# Patient Record
Sex: Female | Born: 1970 | Race: White | Hispanic: No | Marital: Married | Smoking: Former smoker
Health system: Southern US, Community
[De-identification: ages and names within clinical notes are randomized; demographics above are authoritative.]

## PROBLEM LIST (undated history)

## (undated) ENCOUNTER — Inpatient Hospital Stay (HOSPITAL_COMMUNITY): Payer: Self-pay

## (undated) DIAGNOSIS — O99280 Endocrine, nutritional and metabolic diseases complicating pregnancy, unspecified trimester: Secondary | ICD-10-CM

## (undated) DIAGNOSIS — R070 Pain in throat: Secondary | ICD-10-CM

## (undated) DIAGNOSIS — E669 Obesity, unspecified: Secondary | ICD-10-CM

## (undated) DIAGNOSIS — R059 Cough, unspecified: Secondary | ICD-10-CM

## (undated) DIAGNOSIS — J340 Abscess, furuncle and carbuncle of nose: Secondary | ICD-10-CM

## (undated) DIAGNOSIS — J310 Chronic rhinitis: Secondary | ICD-10-CM

## (undated) DIAGNOSIS — D62 Acute posthemorrhagic anemia: Secondary | ICD-10-CM

## (undated) DIAGNOSIS — G43909 Migraine, unspecified, not intractable, without status migrainosus: Secondary | ICD-10-CM

## (undated) DIAGNOSIS — I10 Essential (primary) hypertension: Secondary | ICD-10-CM

## (undated) DIAGNOSIS — M792 Neuralgia and neuritis, unspecified: Secondary | ICD-10-CM

## (undated) DIAGNOSIS — F32A Depression, unspecified: Secondary | ICD-10-CM

## (undated) DIAGNOSIS — E039 Hypothyroidism, unspecified: Secondary | ICD-10-CM

## (undated) DIAGNOSIS — J90 Pleural effusion, not elsewhere classified: Secondary | ICD-10-CM

## (undated) DIAGNOSIS — R569 Unspecified convulsions: Secondary | ICD-10-CM

## (undated) DIAGNOSIS — R002 Palpitations: Secondary | ICD-10-CM

## (undated) DIAGNOSIS — R1011 Right upper quadrant pain: Secondary | ICD-10-CM

## (undated) DIAGNOSIS — E559 Vitamin D deficiency, unspecified: Secondary | ICD-10-CM

## (undated) DIAGNOSIS — G514 Facial myokymia: Secondary | ICD-10-CM

## (undated) DIAGNOSIS — M069 Rheumatoid arthritis, unspecified: Secondary | ICD-10-CM

## (undated) DIAGNOSIS — Z630 Problems in relationship with spouse or partner: Secondary | ICD-10-CM

## (undated) DIAGNOSIS — D649 Anemia, unspecified: Secondary | ICD-10-CM

## (undated) DIAGNOSIS — J101 Influenza due to other identified influenza virus with other respiratory manifestations: Secondary | ICD-10-CM

## (undated) DIAGNOSIS — R509 Fever, unspecified: Secondary | ICD-10-CM

## (undated) DIAGNOSIS — E0590 Thyrotoxicosis, unspecified without thyrotoxic crisis or storm: Secondary | ICD-10-CM

## (undated) DIAGNOSIS — R5383 Other fatigue: Secondary | ICD-10-CM

## (undated) DIAGNOSIS — K219 Gastro-esophageal reflux disease without esophagitis: Secondary | ICD-10-CM

## (undated) DIAGNOSIS — F411 Generalized anxiety disorder: Secondary | ICD-10-CM

## (undated) DIAGNOSIS — F419 Anxiety disorder, unspecified: Secondary | ICD-10-CM

## (undated) DIAGNOSIS — O10919 Unspecified pre-existing hypertension complicating pregnancy, unspecified trimester: Secondary | ICD-10-CM

## (undated) HISTORY — DX: Migraine, unspecified, not intractable, without status migrainosus: G43.909

## (undated) HISTORY — DX: Abscess, furuncle and carbuncle of nose: J34.0

## (undated) HISTORY — DX: Fever, unspecified: R50.9

## (undated) HISTORY — DX: Influenza due to other identified influenza virus with other respiratory manifestations: J10.1

## (undated) HISTORY — DX: Right upper quadrant pain: R10.11

## (undated) HISTORY — DX: Depression, unspecified: F32.A

## (undated) HISTORY — DX: Cough, unspecified: R05.9

## (undated) HISTORY — DX: Facial myokymia: G51.4

## (undated) HISTORY — DX: Neuralgia and neuritis, unspecified: M79.2

## (undated) HISTORY — DX: Problems in relationship with spouse or partner: Z63.0

## (undated) HISTORY — DX: Vitamin D deficiency, unspecified: E55.9

## (undated) HISTORY — DX: Palpitations: R00.2

## (undated) HISTORY — DX: Unspecified convulsions: R56.9

## (undated) HISTORY — DX: Chronic rhinitis: J31.0

## (undated) HISTORY — DX: Anxiety disorder, unspecified: F41.9

## (undated) HISTORY — DX: Generalized anxiety disorder: F41.1

## (undated) HISTORY — DX: Other fatigue: R53.83

## (undated) HISTORY — DX: Pain in throat: R07.0

## (undated) HISTORY — DX: Obesity, unspecified: E66.9

## (undated) HISTORY — DX: Pleural effusion, not elsewhere classified: J90

## (undated) SURGERY — Surgical Case
Anesthesia: Regional

---

## 1998-12-24 ENCOUNTER — Emergency Department (HOSPITAL_COMMUNITY): Admission: EM | Admit: 1998-12-24 | Discharge: 1998-12-24 | Payer: Self-pay | Admitting: Emergency Medicine

## 1999-07-07 ENCOUNTER — Other Ambulatory Visit: Admission: RE | Admit: 1999-07-07 | Discharge: 1999-07-07 | Payer: Self-pay | Admitting: *Deleted

## 2001-02-18 ENCOUNTER — Other Ambulatory Visit: Admission: RE | Admit: 2001-02-18 | Discharge: 2001-02-18 | Payer: Self-pay | Admitting: *Deleted

## 2001-04-10 ENCOUNTER — Emergency Department (HOSPITAL_COMMUNITY): Admission: EM | Admit: 2001-04-10 | Discharge: 2001-04-10 | Payer: Self-pay | Admitting: Internal Medicine

## 2001-12-24 DIAGNOSIS — K219 Gastro-esophageal reflux disease without esophagitis: Secondary | ICD-10-CM

## 2001-12-24 HISTORY — DX: Gastro-esophageal reflux disease without esophagitis: K21.9

## 2002-06-22 ENCOUNTER — Other Ambulatory Visit: Admission: RE | Admit: 2002-06-22 | Discharge: 2002-06-22 | Payer: Self-pay | Admitting: Family Medicine

## 2003-06-30 ENCOUNTER — Encounter: Admission: RE | Admit: 2003-06-30 | Discharge: 2003-07-12 | Payer: Self-pay | Admitting: Family Medicine

## 2003-10-14 ENCOUNTER — Other Ambulatory Visit: Admission: RE | Admit: 2003-10-14 | Discharge: 2003-10-14 | Payer: Self-pay | Admitting: *Deleted

## 2004-03-21 ENCOUNTER — Emergency Department (HOSPITAL_COMMUNITY): Admission: AD | Admit: 2004-03-21 | Discharge: 2004-03-21 | Payer: Self-pay | Admitting: Emergency Medicine

## 2004-12-12 ENCOUNTER — Other Ambulatory Visit: Admission: RE | Admit: 2004-12-12 | Discharge: 2004-12-12 | Payer: Self-pay | Admitting: *Deleted

## 2006-01-02 ENCOUNTER — Other Ambulatory Visit: Admission: RE | Admit: 2006-01-02 | Discharge: 2006-01-02 | Payer: Self-pay | Admitting: *Deleted

## 2006-09-30 ENCOUNTER — Encounter: Admission: RE | Admit: 2006-09-30 | Discharge: 2006-09-30 | Payer: Self-pay | Admitting: Family Medicine

## 2007-02-18 ENCOUNTER — Other Ambulatory Visit: Admission: RE | Admit: 2007-02-18 | Discharge: 2007-02-18 | Payer: Self-pay | Admitting: Family Medicine

## 2007-10-14 ENCOUNTER — Encounter: Admission: RE | Admit: 2007-10-14 | Discharge: 2007-10-14 | Payer: Self-pay | Admitting: Family Medicine

## 2007-10-17 ENCOUNTER — Encounter: Admission: RE | Admit: 2007-10-17 | Discharge: 2007-10-17 | Payer: Self-pay | Admitting: Family Medicine

## 2008-03-09 ENCOUNTER — Encounter (INDEPENDENT_AMBULATORY_CARE_PROVIDER_SITE_OTHER): Payer: Self-pay | Admitting: Diagnostic Radiology

## 2008-03-09 ENCOUNTER — Other Ambulatory Visit: Admission: RE | Admit: 2008-03-09 | Discharge: 2008-03-09 | Payer: Self-pay | Admitting: Diagnostic Radiology

## 2008-03-09 ENCOUNTER — Encounter: Admission: RE | Admit: 2008-03-09 | Discharge: 2008-03-09 | Payer: Self-pay | Admitting: Family Medicine

## 2009-06-22 ENCOUNTER — Inpatient Hospital Stay (HOSPITAL_COMMUNITY): Admission: AD | Admit: 2009-06-22 | Discharge: 2009-06-26 | Payer: Self-pay | Admitting: Obstetrics & Gynecology

## 2009-06-23 ENCOUNTER — Encounter (INDEPENDENT_AMBULATORY_CARE_PROVIDER_SITE_OTHER): Payer: Self-pay | Admitting: Obstetrics and Gynecology

## 2009-11-23 DIAGNOSIS — G514 Facial myokymia: Secondary | ICD-10-CM

## 2009-11-23 HISTORY — DX: Facial myokymia: G51.4

## 2010-04-26 ENCOUNTER — Encounter: Admission: RE | Admit: 2010-04-26 | Discharge: 2010-04-26 | Payer: Self-pay | Admitting: Surgery

## 2010-07-04 ENCOUNTER — Encounter (HOSPITAL_COMMUNITY): Admission: RE | Admit: 2010-07-04 | Discharge: 2010-07-05 | Payer: Self-pay | Admitting: Endocrinology

## 2010-09-14 ENCOUNTER — Ambulatory Visit (HOSPITAL_COMMUNITY): Admission: RE | Admit: 2010-09-14 | Discharge: 2010-09-14 | Payer: Self-pay | Admitting: Endocrinology

## 2011-01-14 ENCOUNTER — Encounter: Payer: Self-pay | Admitting: Family Medicine

## 2011-04-01 LAB — CBC
Hemoglobin: 8 g/dL — ABNORMAL LOW (ref 12.0–15.0)
MCHC: 35.7 g/dL (ref 30.0–36.0)
Platelets: 165 10*3/uL (ref 150–400)
RDW: 14.4 % (ref 11.5–15.5)

## 2011-04-02 LAB — CBC
MCHC: 35.3 g/dL (ref 30.0–36.0)
RBC: 3.6 MIL/uL — ABNORMAL LOW (ref 3.87–5.11)
WBC: 12.4 10*3/uL — ABNORMAL HIGH (ref 4.0–10.5)

## 2011-04-02 LAB — RPR: RPR Ser Ql: NONREACTIVE

## 2011-05-08 NOTE — Op Note (Signed)
NAME:  Tracey Nichols, Tracey Nichols                ACCOUNT NO.:  [id number]   MEDICAL RECORD NO.:  [id number]          PATIENT TYPE:  INP   LOCATION:  9114                          FACILITY:  [facility]   PHYSICIAN:  Maxie Better, M.D.DATE OF BIRTH:  16-May-1971   DATE OF PROCEDURE:  06/23/2009  DATE OF DISCHARGE:                               OPERATIVE REPORT   PREOPERATIVE DIAGNOSIS:  Arrest of dilatation, term gestation.   PROCEDURE:  Primary cesarean section, Kerr hysterotomy.   POSTOPERATIVE DIAGNOSIS:  Arrest of dilatation, term gestation.   ANESTHESIA:  Epidural.   SURGEON:  Maxie Better, MD   ASSISTANT:  Genia Del, MD   PROCEDURE:  Under adequate epidural anesthesia, the patient was placed  in the supine position with a left lateral tilt.  She was sterilely  prepped and draped in usual fashion.  Indwelling Foley catheter was  already in place.  Pfannenstiel skin incision was then made carried down  to the rectus fascia.  Rectus fascia was incised in midline.  The rectus  fascia was then bluntly and sharply dissected off the rectus muscle in  superior and inferior fashion.  The rectus muscles split in midline.  The parietal peritoneum was opened sharply and extended.  The  vesicouterine peritoneum was opened carefully in a transverse fashion.  The bladder was then bluntly dissected off the lower uterine segment and  displaced inferiorly.  A curvilinear low transverse uterine incision was  then made and extended with bandage scissors.  Subsequent delivery of a  live female from the right occiput posterior presentation with a large  caput was accomplished.  Baby was bulb suctioned in the abdomen.  Cord  was clamped and cut.  The baby was transferred to the awaiting  pediatrician, who assigned Apgars of 9 and 9 at 1 and 5 minute.  The  placenta was manually removed.  Uterine cavity was cleaned of debris.  Uterine incision had no extension.  It was closed with 0 Monocryl  running  locked stitch.  First layer and second layer was imbricated with  0 Monocryl suture.  Bleeding on the right angle of the incision resulted  in placement of 2 figure-of-eight sutures.  Additional bleeding was  cauterized.  Normal tubes and ovaries were noted to be normal.  The  uterus was transiently exteriorized in order to assess the right angle  of the incision with respect to the bleeding.  Uterus was then returned  to the abdomen after the abdomen was copiously irrigated and suctioned  with good hemostasis noted.  The parietal peritoneum was not closed by  Dr. Seymour Bars.  The rectus fascia was closed with 0 Vicryl x2.  The  subcutaneous areas irrigated, small bleeders cauterized.  Interrupted 2-  0 plain sutures placed in the skin approximated using Ethicon staples.   SPECIMENS:  Placenta sent to pathology due to maternal low-grade  temperature.  Estimated blood loss was 500 mL.  Intraoperative fluid 2  L.  Urine output 200 mL.  Sponge and instrument counts x2 was correct.  Complication was none.  The patient tolerated the procedure well and  was  transferred to recovery in stable condition.      Maxie Better, M.D.  Electronically Signed     [place]/MEDQ  D:  06/23/2009  T:  06/24/2009  Job:  [id number]

## 2011-05-11 NOTE — Discharge Summary (Signed)
NAMESALEEMAH, Tracey Nichols                ACCOUNT NO.:  [id number]   MEDICAL RECORD NO.:  [id number]          PATIENT TYPE:  INP   LOCATION:  9114                          FACILITY:  [facility]   PHYSICIAN:  Maxie Better, M.D.DATE OF BIRTH:  [birth year]   DATE OF ADMISSION:  06/22/2009  DATE OF DISCHARGE:  06/26/2009                               DISCHARGE SUMMARY   ADMISSION DIAGNOSES:  1. Term gestation in labor.  2. Chronic hypertension.  3. Hyperthyroidism.  4. Group B strep culture positive.   DISCHARGE DIAGNOSES:  1. Term gestation, delivered.  2. Arrest of dilatation.  3. Hyperthyroidism.  4. Chronic hypertension.  5. Postop anemia.   PROCEDURE:  Primary cesarean section Kerr hysterotomy.   HISTORY OF PRESENT ILLNESS:  A 40 year old gravida 1, para 0 female at  39-3/7 weeks' and was admitted in early labor by the midwife.  The  prenatal course had been notable for hyperthyroidism for which the  patient has been on PTU.  She is on chronic hypertension, well  controlled, on Aldomet and she is group B strep culture positive.   HOSPITAL COURSE:  The patient was admitted on June 22, 2009, at that  time she was 2-3, 90%, -1, bulging bag of membranes.  Her tracing was  reactive.  She was contracting every 3-4 minutes and it lasting 60-80  seconds with moderate intensity.  She was started on antibiotics due to  group B strep being positive.  She was continued on her blood pressure  medication and her PTU.  The patient subsequently had Pitocin  augmentation of her labor.  She had artificial rupture of membranes when  she was 4 cm, at 90%, -1.  Intrauterine pressure catheter was placed at  that time.  The patient had epidural for pain management.  During her  labor course, the patient had amnioinfusion placed for variable  deceleration associated with her contractions.  She was subsequently  progressed to 9 cm/ 80-90%/+1 with a right occiput posterior  presentation with overriding  sutures.  The patient was given the option  to continue or to proceed with a primary cesarean section.  The patient  requested a cesarean section due to lack of progress and she was taken  to the operating room, where she underwent a primary cesarean section,  delivery of a live female, 7 pounds 6 ounces from the right occiput  posterior presentation.  Normal tubes and ovaries.  Apgars of 9 and 9.  Her postoperative course was unremarkable.  Her CBC on postop day #1  showed a hemoglobin of 8, hematocrit of 22.4, platelet count of 165,000,  and white count of 10.7.  On June 24, 2009, the patient had her Aldomet  discontinued.  She was asymptomatic from her anemia.  Iron  supplementation was started.  The patient continued to do well.  By  postop day #3, the patient was deemed well to be discharged home.  She  was tolerating a regular diet.  She was afebrile.  Incision had no  evidence of infection.   DISPOSITION:  Home.   CONDITION:  Stable.  DISCHARGE MEDICATIONS:  1. PTU 50 mg p.o. every other day.  2. Motrin 600 mg every 6 hours p.r.n. pain.  3. Percocet 1-2 tablets every 3-4 hours p.r.n. pain.   DISCHARGE INSTRUCTIONS:  Per the postpartum booklet given.  Followup  appointment at [facility] in 6 weeks.      Maxie Better, M.D.  Electronically Signed     [place]/MEDQ  D:  07/10/2009  T:  07/11/2009  Job:  [id number]

## 2011-10-10 LAB — OB RESULTS CONSOLE ANTIBODY SCREEN: Antibody Screen: NEGATIVE

## 2011-10-10 LAB — OB RESULTS CONSOLE ABO/RH

## 2011-10-10 LAB — OB RESULTS CONSOLE HIV ANTIBODY (ROUTINE TESTING): HIV: NONREACTIVE

## 2011-10-10 LAB — OB RESULTS CONSOLE RUBELLA ANTIBODY, IGM: Rubella: IMMUNE

## 2011-12-24 ENCOUNTER — Other Ambulatory Visit: Payer: Self-pay

## 2012-01-03 ENCOUNTER — Ambulatory Visit (HOSPITAL_COMMUNITY)
Admission: RE | Admit: 2012-01-03 | Discharge: 2012-01-03 | Disposition: A | Discharge status: Home / Self Care | Payer: 59 | Source: Ambulatory Visit | Attending: Obstetrics & Gynecology | Admitting: Obstetrics & Gynecology

## 2012-01-03 DIAGNOSIS — O09529 Supervision of elderly multigravida, unspecified trimester: Secondary | ICD-10-CM

## 2012-01-03 DIAGNOSIS — E039 Hypothyroidism, unspecified: Secondary | ICD-10-CM

## 2012-01-03 DIAGNOSIS — M069 Rheumatoid arthritis, unspecified: Secondary | ICD-10-CM

## 2012-01-03 DIAGNOSIS — O10919 Unspecified pre-existing hypertension complicating pregnancy, unspecified trimester: Secondary | ICD-10-CM

## 2012-01-03 DIAGNOSIS — O99280 Endocrine, nutritional and metabolic diseases complicating pregnancy, unspecified trimester: Secondary | ICD-10-CM

## 2012-01-03 HISTORY — DX: Thyrotoxicosis, unspecified without thyrotoxic crisis or storm: E05.90

## 2012-01-03 HISTORY — DX: Hypothyroidism, unspecified: E03.9

## 2012-01-03 HISTORY — DX: Essential (primary) hypertension: I10

## 2012-01-03 HISTORY — DX: Rheumatoid arthritis, unspecified: M06.9

## 2012-01-04 ENCOUNTER — Encounter (HOSPITAL_COMMUNITY): Payer: Self-pay

## 2012-01-04 NOTE — Progress Notes (Signed)
MATERNAL FETAL MEDICINE CONSULT  Patient Name: Tracey Nichols Medical Record Number:  [id number] Date of Birth: May 18, 1971 Requesting Physician Name:  No att. providers found Date of Service: 01/04/2012  Chief Complaint Multiple medical conditions in pregnancy  History of Present Illness Tracey VANOVERBEKE was seen today  secondary to multiple medical problems in pregnancy at the request of Dr. Juliene Pina.  The patient is a 41 y.o. G2P1001 carrying a dichorionic diamniotic twin pregnancy at [redacted]w[redacted]d, with an EDC of 05/18/2012, by Last Menstrual Period.  Her medical problems include rheumatoid arthritis, a history of hyperthyroidism treated with radioiodine thyroid ablation resulting in hypothyroidism, chronic hypertension, and advanced maternal age.  Her rheumatoid arthritis is long standing and predominantly effects all the joints of her upper extremities with virtual sparing of her lower extremities.  She is currently on no medication for her RA and reports increasing problems with upper extremity joint pain since pregnancy began.  She has not received pain relief with acetaminophen. In her first pregnancy her RA symptoms noticeably improved after the 3rd month of pregnancy, but remission has not occurred thus far in this pregnancy.  In her first pregnancy she developed hyperthyroidism and required propylthiouracil therapy, on which she developed significant neutropenia.  After pregnancy the hyperthyroidism spontaneously resolved.  She underwent a radioiodine thyroid ablation to prevent future recurrences of hyperthyroidism.  She is now hypothyroid and on Synthroid replacement (125 mcg po daily).  She has a history of mild hypertension beginning in her early 80's.  Outside of pregnancy her hypertension was under good control on lisinopril.  This was switched to methyldopa (250 mg po bid) prior to conception and her blood pressures has remained in the 110's-120's/70's-80's.    Review of Systems Pertinent items are  noted in HPI.  Patient History OB History    Grav Para Term Preterm Abortions TAB SAB Ect Mult Living   2 1 1       1      # Outc Date GA Lbr Len/2nd Wgt Sex Del Anes PTL Lv   1 TRM 2010 [redacted]w[redacted]d    LTCS  No Yes   Comments: Arrest of 1st stage   2 CUR               Past Medical History  Diagnosis Date  . Hyperthyroidism     Had hyperthyroidism in first pregnancy, subsequently treated with radioiodine  . Hypothyroidism     s/p radioiodine treatment for hyperthyroidism  . Rheumatoid arthritis   . Hypertension     Past Surgical History  Procedure Date  . Cesarean section     History   Social History  . Marital Status: Married    Spouse Name: N/A    Number of Children: N/A  . Years of Education: N/A   Social History Main Topics  . Smoking status: Never Smoker   . Smokeless tobacco: Never Used  . Alcohol Use: No  . Drug Use: No  . Sexually Active: Yes    Birth Control/ Protection: None   Other Topics Concern  . None   Social History Narrative  . None   In addition, the patient has no family history of mental retardation, birth defects, or genetic diseases.  Physical Examination Vitals:  BP 111/71, Pulse 95, Weight 157 lbs. General appearance - alert, well appearing, and in no distress  Assessment and Recommendations 1.  Rheumatoid Arthritis.  Tracey Nichols symptoms appear to be worsening since stopping her RA medications.  She has been  treated with hydroxychloroquine, Humira, and methotrexate in the past with varying degrees of success.  Hydroxychloroquine would be the most appropriate choice for treatment during pregnancy as it has a favorable fetal risk profile compared to Humira and methotrexate.  I discussed restarting this medication, but she is reluctant to do so due to concern for fetal effects.  She feels her pain level is manageable at this time, but will reconsider starting the medication if her symptoms worsen.  Although less common than in other autoimmune  diseases, RA can lead to nephritis and hypertension which can arise for the first time or become exacerbated in pregnancy.  To establish a baseline for the patient a CBC, AST, ALT, serum creatinine, and a 24 hour urine collection for protein should be performed.  These labs should be repeated as clinically indicated based on disease symptoms or the appearance of hypertension.  As autoimmune diseases are associated with fetal growth restriction the patient should have serial growth scans every 4 weeks after her anatomy scan at approximately 18 weeks.  2.  Hypothyroidism.  Tracey Nichols is now euthyroid on her current dose of Synthroid based on her last thyroid function tests.  Another set was just drawn and is pending.  Her thyroid function tests are being monitored approximately every 4 weeks and adjusted upward as needed.  Given her twin pregnancy relatively large amounts of Synthroid will likely be required as pregnancy progresses.  Other than this serial laboratory monitoring no further maternal or fetal assessments are required. 3.  Chronic Hypertension.  Based on Tracey Nichols's history and her BP today it appears as though her hypertension is rather mild and easily to control.  Today her BP is normal on a very modest dose of methyldopa.  It would be reasonable to continue with this medication and even adjust it upward as needed to maximum dose of 500 mg po four times daily.  Ultimately, if this maximum dose does not provide adequate blood pressure control an additional age such as labetalol or nifedipine could be added and titrated upward. 4.  Advanced Maternal Age.  We discussed the risks of aneuploidy associated with a maternal age of 89.  The patient has had a normal first trimester screen and MSAFP.  She is not interested in meeting with a genetic counselor or having invasive genetic testing. 5.  Dichorionic Diamniotic Twin Pregnancy.  [name] has a dichorionic diamniotictwin pregnancy.  As a result she  will require monthly ultrasounds to assess fetal growth.  In addition, given her other medical comorbidities once or twice weekly fetal testing should be begun at 32 weeks and continued until delivery.  An elective delivery should not be undertaken before 38 weeks of gestation, but delivery can be effected at an earlier gestational age if medically or obstetrically indicated.  I spent 45 minutes with Ms. Ericsson today of which 50% was face-to-face counseling.  Rema Fendt

## 2012-01-31 ENCOUNTER — Encounter: Payer: Self-pay | Admitting: Obstetrics & Gynecology

## 2012-01-31 LAB — US OB COMP + 14 WK

## 2012-02-04 ENCOUNTER — Other Ambulatory Visit (HOSPITAL_COMMUNITY): Payer: Self-pay | Admitting: Obstetrics & Gynecology

## 2012-02-04 DIAGNOSIS — IMO0001 Reserved for inherently not codable concepts without codable children: Secondary | ICD-10-CM

## 2012-02-12 ENCOUNTER — Ambulatory Visit (HOSPITAL_COMMUNITY)
Admission: RE | Admit: 2012-02-12 | Discharge: 2012-02-12 | Disposition: A | Discharge status: Home / Self Care | Payer: 59 | Source: Ambulatory Visit | Attending: Obstetrics & Gynecology | Admitting: Obstetrics & Gynecology

## 2012-02-12 DIAGNOSIS — O10019 Pre-existing essential hypertension complicating pregnancy, unspecified trimester: Secondary | ICD-10-CM | POA: Insufficient documentation

## 2012-02-12 DIAGNOSIS — O30009 Twin pregnancy, unspecified number of placenta and unspecified number of amniotic sacs, unspecified trimester: Secondary | ICD-10-CM | POA: Insufficient documentation

## 2012-02-12 DIAGNOSIS — Z1389 Encounter for screening for other disorder: Secondary | ICD-10-CM | POA: Insufficient documentation

## 2012-02-12 DIAGNOSIS — O09529 Supervision of elderly multigravida, unspecified trimester: Secondary | ICD-10-CM | POA: Insufficient documentation

## 2012-02-12 DIAGNOSIS — O358XX0 Maternal care for other (suspected) fetal abnormality and damage, not applicable or unspecified: Secondary | ICD-10-CM | POA: Insufficient documentation

## 2012-02-12 DIAGNOSIS — O350XX0 Maternal care for (suspected) central nervous system malformation in fetus, not applicable or unspecified: Secondary | ICD-10-CM | POA: Insufficient documentation

## 2012-02-12 DIAGNOSIS — O34219 Maternal care for unspecified type scar from previous cesarean delivery: Secondary | ICD-10-CM | POA: Insufficient documentation

## 2012-02-12 DIAGNOSIS — IMO0001 Reserved for inherently not codable concepts without codable children: Secondary | ICD-10-CM

## 2012-02-12 DIAGNOSIS — O3500X0 Maternal care for (suspected) central nervous system malformation or damage in fetus, unspecified, not applicable or unspecified: Secondary | ICD-10-CM | POA: Insufficient documentation

## 2012-02-12 DIAGNOSIS — Z363 Encounter for antenatal screening for malformations: Secondary | ICD-10-CM | POA: Insufficient documentation

## 2012-02-13 ENCOUNTER — Other Ambulatory Visit: Payer: Self-pay

## 2012-03-16 ENCOUNTER — Inpatient Hospital Stay (HOSPITAL_COMMUNITY)
Admission: AD | Admit: 2012-03-16 | Discharge: 2012-03-16 | Disposition: A | Discharge status: Hospice | Payer: 59 | Source: Ambulatory Visit | Attending: Obstetrics & Gynecology | Admitting: Obstetrics & Gynecology

## 2012-03-16 DIAGNOSIS — O30049 Twin pregnancy, dichorionic/diamniotic, unspecified trimester: Secondary | ICD-10-CM | POA: Insufficient documentation

## 2012-03-16 DIAGNOSIS — E039 Hypothyroidism, unspecified: Secondary | ICD-10-CM | POA: Insufficient documentation

## 2012-03-16 DIAGNOSIS — O4700 False labor before 37 completed weeks of gestation, unspecified trimester: Secondary | ICD-10-CM | POA: Insufficient documentation

## 2012-03-16 DIAGNOSIS — E079 Disorder of thyroid, unspecified: Secondary | ICD-10-CM | POA: Insufficient documentation

## 2012-03-16 DIAGNOSIS — O10019 Pre-existing essential hypertension complicating pregnancy, unspecified trimester: Secondary | ICD-10-CM | POA: Insufficient documentation

## 2012-03-16 DIAGNOSIS — O30009 Twin pregnancy, unspecified number of placenta and unspecified number of amniotic sacs, unspecified trimester: Secondary | ICD-10-CM | POA: Insufficient documentation

## 2012-03-16 LAB — URINALYSIS, ROUTINE W REFLEX MICROSCOPIC
Glucose, UA: NEGATIVE mg/dL
Leukocytes, UA: NEGATIVE
Nitrite: NEGATIVE
Protein, ur: NEGATIVE mg/dL

## 2012-03-16 NOTE — Discharge Instructions (Signed)

## 2012-03-16 NOTE — ED Provider Notes (Signed)
History  Tracey Nichols 41 y.o. G2P1001 Twin gestation Di-Di 31 wks  RP:  UCs x couple of hours  HPP/HPI:  Mild UC's more frequent x 2 hrs, q about 4-5 min.  No AF leak.  FMs pos.  No vaginal bleeding.                  Mild cHTN, no Rx needed currently.  Hypothyroidism on Synthroid.  RA stable.  No PIH Sx.       Chief Complaint  Patient presents with  . Contractions    OB History    Grav Para Term Preterm Abortions TAB SAB Ect Mult Living   2 1 1       1       Past Medical History  Diagnosis Date  . Hyperthyroidism     Had hyperthyroidism in first pregnancy, subsequently treated with radioiodine  . Hypothyroidism     s/p radioiodine treatment for hyperthyroidism  . Rheumatoid arthritis   . Hypertension     Past Surgical History  Procedure Date  . Cesarean section     No family history on file.  History  Substance Use Topics  . Smoking status: Never Smoker   . Smokeless tobacco: Never Used  . Alcohol Use: No    Allergies:  Allergies  Allergen Reactions  . Propylthiouracil Other (See Comments)    Lowered white blood cell count  . Wellbutrin (Bupropion Hcl) Other (See Comments)    seizure  . Sulfa Antibiotics Rash    Prescriptions prior to admission  Medication Sig Dispense Refill  . acetaminophen (TYLENOL) 500 MG tablet Take 1,000 mg by mouth every 6 (six) hours as needed. pain      . calcium carbonate (TUMS - DOSED IN MG ELEMENTAL CALCIUM) 500 MG chewable tablet Chew 1 tablet by mouth daily as needed. Stomach acid      . ferrous sulfate 325 (65 FE) MG tablet Take 325 mg by mouth 3 (three) times a week.      . hydroxychloroquine (PLAQUENIL) 200 MG tablet Take 200 mg by mouth daily.       Marland Kitchen levothyroxine (SYNTHROID, LEVOTHROID) 125 MCG tablet Take 125 mcg by mouth daily.      . Prenatal Vit-Fe Fumarate-FA (PRENATAL MULTIVITAMIN) TABS Take 1 tablet by mouth daily.        Physical Exam   Blood pressure 143/76, pulse 95, temperature 97.6 F (36.4 C),  resp. rate 20, height 5' 2.5" (1.588 m), weight 74.39 kg (164 lb), last menstrual period 08/12/2011, SpO2 100.00%.  Ut  UC felt, mild VE closed/long/firm/post/high  ED Course  UC mild, spacing to 5-7 min.   FHR A 140's, B 150's  Variability present, no deceleration.  U/A neg FFN pending  A/P  31 wks Twin Di-Di no PT cervical change.  BHs per monitoring and UC palpation.  Pending FFN.         Not in active labor.  BMethasone discussed.  Will hold for now if FFN neg.         D/C home with relative bed rest if FFN neg.  PTL warnings discussed.  F/U with me later this wk.    Genia Del  MD  03/16/2012  At 10:43 PM

## 2012-03-16 NOTE — MAU Note (Signed)
Pt reports feeling tightness and cramping since 6 pm, became more like contractions and was told to come in. Denies bleeding or ROM

## 2012-04-22 ENCOUNTER — Encounter (HOSPITAL_COMMUNITY): Payer: Self-pay

## 2012-04-25 ENCOUNTER — Encounter (HOSPITAL_COMMUNITY)
Admission: RE | Admit: 2012-04-25 | Discharge: 2012-04-25 | Disposition: A | Discharge status: Home / Self Care | Payer: 59 | Source: Ambulatory Visit | Attending: Obstetrics & Gynecology | Admitting: Obstetrics & Gynecology

## 2012-04-25 ENCOUNTER — Encounter (HOSPITAL_COMMUNITY): Payer: Self-pay

## 2012-04-25 HISTORY — DX: Anemia, unspecified: D64.9

## 2012-04-25 HISTORY — DX: Gastro-esophageal reflux disease without esophagitis: K21.9

## 2012-04-25 LAB — CBC
HCT: 37.3 % (ref 36.0–46.0)
MCH: 30.7 pg (ref 26.0–34.0)
MCHC: 32.7 g/dL (ref 30.0–36.0)
MCV: 93.7 fL (ref 78.0–100.0)
RDW: 15.4 % (ref 11.5–15.5)
WBC: 8.4 10*3/uL (ref 4.0–10.5)

## 2012-04-25 NOTE — Pre-Procedure Instructions (Signed)
Pt will bring her BP med in with her on DOS to take on arrival. I instructed her to take at home with a sip of water early am, but pt prefers to take closer to her normal time of mid morning.

## 2012-04-25 NOTE — Patient Instructions (Addendum)
YOUR PROCEDURE IS SCHEDULED ON:5/81/13  ENTER THROUGH THE MAIN ENTRANCE OF [facility] [id number] am  USE DESK PHONE AND DIAL [id number] TO INFORM us OF YOUR ARRIVAL  CALL [phone] IF YOU HAVE ANY QUESTIONS OR PROBLEMS PRIOR TO YOUR ARRIVAL.  REMEMBER: DO NOT EAT AFTER MIDNIGHT :Tuesday  SPECIAL INSTRUCTIONS:water is ok until 6am on Wed   YOU MAY BRUSH YOUR TEETH THE MORNING OF SURGERY   TAKE THESE MEDICINES THE DAY OF SURGERY WITH SIP OF WATER:Synthroid, Methyldopa   DO NOT WEAR JEWELRY, EYE MAKEUP, LIPSTICK OR DARK FINGERNAIL POLISH DO NOT WEAR LOTIONS  DO NOT SHAVE FOR 48 HOURS PRIOR TO SURGERY  YOU WILL NOT BE ALLOWED TO DRIVE YOURSELF HOME.  NAME OF DRIVER:Ryan Alyce Pagan

## 2012-04-28 ENCOUNTER — Other Ambulatory Visit: Payer: Self-pay | Admitting: Obstetrics & Gynecology

## 2012-04-29 ENCOUNTER — Inpatient Hospital Stay (HOSPITAL_COMMUNITY)
Admission: AD | Admit: 2012-04-29 | Discharge: 2012-05-02 | DRG: 765 | Disposition: A | Discharge status: Home / Self Care | Payer: 59 | Source: Ambulatory Visit | Attending: Obstetrics and Gynecology | Admitting: Obstetrics and Gynecology

## 2012-04-29 ENCOUNTER — Encounter (HOSPITAL_COMMUNITY): Payer: Self-pay | Admitting: Anesthesiology

## 2012-04-29 ENCOUNTER — Other Ambulatory Visit (HOSPITAL_COMMUNITY): Payer: Self-pay | Admitting: *Deleted

## 2012-04-29 ENCOUNTER — Encounter (HOSPITAL_COMMUNITY)
Admission: AD | Disposition: A | Discharge status: Home / Self Care | Payer: Self-pay | Source: Ambulatory Visit | Attending: Obstetrics and Gynecology

## 2012-04-29 ENCOUNTER — Encounter (HOSPITAL_COMMUNITY): Payer: Self-pay | Admitting: *Deleted

## 2012-04-29 ENCOUNTER — Inpatient Hospital Stay (HOSPITAL_COMMUNITY): Payer: 59 | Admitting: Anesthesiology

## 2012-04-29 DIAGNOSIS — O10919 Unspecified pre-existing hypertension complicating pregnancy, unspecified trimester: Secondary | ICD-10-CM

## 2012-04-29 DIAGNOSIS — Z22330 Carrier of Group B streptococcus: Secondary | ICD-10-CM

## 2012-04-29 DIAGNOSIS — O3090 Multiple gestation, unspecified, unspecified trimester: Secondary | ICD-10-CM | POA: Diagnosis present

## 2012-04-29 DIAGNOSIS — O99284 Endocrine, nutritional and metabolic diseases complicating childbirth: Secondary | ICD-10-CM | POA: Diagnosis present

## 2012-04-29 DIAGNOSIS — D62 Acute posthemorrhagic anemia: Secondary | ICD-10-CM | POA: Diagnosis not present

## 2012-04-29 DIAGNOSIS — O1002 Pre-existing essential hypertension complicating childbirth: Secondary | ICD-10-CM | POA: Diagnosis present

## 2012-04-29 DIAGNOSIS — O9903 Anemia complicating the puerperium: Secondary | ICD-10-CM | POA: Diagnosis not present

## 2012-04-29 DIAGNOSIS — O30009 Twin pregnancy, unspecified number of placenta and unspecified number of amniotic sacs, unspecified trimester: Secondary | ICD-10-CM | POA: Diagnosis present

## 2012-04-29 DIAGNOSIS — O99892 Other specified diseases and conditions complicating childbirth: Secondary | ICD-10-CM | POA: Diagnosis present

## 2012-04-29 DIAGNOSIS — O34219 Maternal care for unspecified type scar from previous cesarean delivery: Principal | ICD-10-CM | POA: Diagnosis present

## 2012-04-29 DIAGNOSIS — E039 Hypothyroidism, unspecified: Secondary | ICD-10-CM | POA: Diagnosis present

## 2012-04-29 DIAGNOSIS — E079 Disorder of thyroid, unspecified: Secondary | ICD-10-CM | POA: Diagnosis present

## 2012-04-29 HISTORY — DX: Endocrine, nutritional and metabolic diseases complicating pregnancy, unspecified trimester: O99.280

## 2012-04-29 HISTORY — DX: Acute posthemorrhagic anemia: D62

## 2012-04-29 HISTORY — DX: Hypothyroidism, unspecified: E03.9

## 2012-04-29 HISTORY — DX: Unspecified pre-existing hypertension complicating pregnancy, unspecified trimester: O10.919

## 2012-04-29 LAB — CBC
HCT: 35.9 % — ABNORMAL LOW (ref 36.0–46.0)
Hemoglobin: 12 g/dL (ref 12.0–15.0)
MCH: 30.8 pg (ref 26.0–34.0)
MCHC: 33.4 g/dL (ref 30.0–36.0)
MCV: 92.3 fL (ref 78.0–100.0)
Platelets: 224 10*3/uL (ref 150–400)
RBC: 3.89 MIL/uL (ref 3.87–5.11)
RDW: 15.4 % (ref 11.5–15.5)
WBC: 9.6 10*3/uL (ref 4.0–10.5)

## 2012-04-29 LAB — SAMPLE TO BLOOD BANK

## 2012-04-29 SURGERY — Surgical Case
Anesthesia: Epidural | Site: Abdomen | Wound class: Clean Contaminated

## 2012-04-29 MED ORDER — CEFAZOLIN SODIUM-DEXTROSE 2-3 GM-% IV SOLR
2.0000 g | INTRAVENOUS | Status: AC
  Administered 2012-04-29: 2 g via INTRAVENOUS
  Filled 2012-04-29: qty 50

## 2012-04-29 MED ORDER — NALBUPHINE HCL 10 MG/ML IJ SOLN
5.0000 mg | INTRAMUSCULAR | Status: DC | PRN
  Filled 2012-04-29: qty 1

## 2012-04-29 MED ORDER — METHYLDOPA 250 MG PO TABS
250.0000 mg | ORAL_TABLET | Freq: Every day | ORAL | Status: DC
  Administered 2012-04-30 – 2012-05-02 (×3): 250 mg via ORAL
  Filled 2012-04-29 (×4): qty 1

## 2012-04-29 MED ORDER — METOCLOPRAMIDE HCL 5 MG/ML IJ SOLN: 10.0000 mg | Freq: Three times a day (TID) | INTRAMUSCULAR | Status: DC | PRN

## 2012-04-29 MED ORDER — BUPIVACAINE IN DEXTROSE 0.75-8.25 % IT SOLN
INTRATHECAL | Status: DC | PRN
  Administered 2012-04-29: 1.5 mL via INTRATHECAL

## 2012-04-29 MED ORDER — SODIUM CHLORIDE 0.9 % IJ SOLN: 3.0000 mL | INTRAMUSCULAR | Status: DC | PRN

## 2012-04-29 MED ORDER — FAMOTIDINE IN NACL 20-0.9 MG/50ML-% IV SOLN
INTRAVENOUS | Status: AC
  Administered 2012-04-29: 20 mg
  Filled 2012-04-29: qty 50

## 2012-04-29 MED ORDER — KETOROLAC TROMETHAMINE 30 MG/ML IJ SOLN: 30.0000 mg | Freq: Four times a day (QID) | INTRAMUSCULAR | Status: AC | PRN

## 2012-04-29 MED ORDER — METHYLERGONOVINE MALEATE 0.2 MG PO TABS: 0.2000 mg | ORAL_TABLET | ORAL | Status: DC | PRN

## 2012-04-29 MED ORDER — CITRIC ACID-SODIUM CITRATE 334-500 MG/5ML PO SOLN
ORAL | Status: AC
  Administered 2012-04-29: 30 mL
  Filled 2012-04-29: qty 15

## 2012-04-29 MED ORDER — TETANUS-DIPHTH-ACELL PERTUSSIS 5-2.5-18.5 LF-MCG/0.5 IM SUSP: 0.5000 mL | Freq: Once | INTRAMUSCULAR | Status: DC

## 2012-04-29 MED ORDER — DIBUCAINE 1 % RE OINT: 1.0000 "application " | TOPICAL_OINTMENT | RECTAL | Status: DC | PRN

## 2012-04-29 MED ORDER — WITCH HAZEL-GLYCERIN EX PADS: 1.0000 "application " | MEDICATED_PAD | CUTANEOUS | Status: DC | PRN

## 2012-04-29 MED ORDER — SIMETHICONE 80 MG PO CHEW: 80.0000 mg | CHEWABLE_TABLET | ORAL | Status: DC | PRN

## 2012-04-29 MED ORDER — MENTHOL 3 MG MT LOZG: 1.0000 | LOZENGE | OROMUCOSAL | Status: DC | PRN

## 2012-04-29 MED ORDER — MEPERIDINE HCL 25 MG/ML IJ SOLN: 6.2500 mg | INTRAMUSCULAR | Status: DC | PRN

## 2012-04-29 MED ORDER — DIPHENHYDRAMINE HCL 25 MG PO CAPS: 25.0000 mg | ORAL_CAPSULE | ORAL | Status: DC | PRN

## 2012-04-29 MED ORDER — ZOLPIDEM TARTRATE 5 MG PO TABS: 5.0000 mg | ORAL_TABLET | Freq: Every evening | ORAL | Status: DC | PRN

## 2012-04-29 MED ORDER — OXYCODONE-ACETAMINOPHEN 5-325 MG PO TABS
1.0000 | ORAL_TABLET | ORAL | Status: DC | PRN
  Administered 2012-04-30: 1 via ORAL
  Administered 2012-04-30: 2 via ORAL
  Administered 2012-05-02: 1 via ORAL
  Filled 2012-04-29 (×5): qty 1

## 2012-04-29 MED ORDER — ONDANSETRON HCL 4 MG/2ML IJ SOLN: 4.0000 mg | Freq: Three times a day (TID) | INTRAMUSCULAR | Status: DC | PRN

## 2012-04-29 MED ORDER — LACTATED RINGERS IV SOLN
INTRAVENOUS | Status: DC
  Administered 2012-04-30: 01:00:00 via INTRAVENOUS

## 2012-04-29 MED ORDER — BUPIVACAINE HCL (PF) 0.25 % IJ SOLN
INTRAMUSCULAR | Status: DC | PRN
  Administered 2012-04-29: 10 mL

## 2012-04-29 MED ORDER — PHENYLEPHRINE HCL 10 MG/ML IJ SOLN
INTRAMUSCULAR | Status: DC | PRN
  Administered 2012-04-29 (×7): 80 ug via INTRAVENOUS

## 2012-04-29 MED ORDER — DIPHENHYDRAMINE HCL 50 MG/ML IJ SOLN: 25.0000 mg | INTRAMUSCULAR | Status: DC | PRN

## 2012-04-29 MED ORDER — SIMETHICONE 80 MG PO CHEW
80.0000 mg | CHEWABLE_TABLET | Freq: Three times a day (TID) | ORAL | Status: DC
  Administered 2012-04-29 – 2012-05-02 (×10): 80 mg via ORAL

## 2012-04-29 MED ORDER — METHYLERGONOVINE MALEATE 0.2 MG/ML IJ SOLN: 0.2000 mg | INTRAMUSCULAR | Status: DC | PRN

## 2012-04-29 MED ORDER — IBUPROFEN 600 MG PO TABS
600.0000 mg | ORAL_TABLET | Freq: Four times a day (QID) | ORAL | Status: DC | PRN
  Filled 2012-04-29 (×5): qty 1

## 2012-04-29 MED ORDER — SENNOSIDES-DOCUSATE SODIUM 8.6-50 MG PO TABS
2.0000 | ORAL_TABLET | Freq: Every day | ORAL | Status: DC
  Administered 2012-04-29 – 2012-05-01 (×3): 2 via ORAL

## 2012-04-29 MED ORDER — SCOPOLAMINE 1 MG/3DAYS TD PT72: 1.0000 | MEDICATED_PATCH | Freq: Once | TRANSDERMAL | Status: DC

## 2012-04-29 MED ORDER — FENTANYL CITRATE 0.05 MG/ML IJ SOLN
INTRAMUSCULAR | Status: DC | PRN
  Administered 2012-04-29: 15 ug via INTRATHECAL

## 2012-04-29 MED ORDER — MORPHINE SULFATE (PF) 0.5 MG/ML IJ SOLN
INTRAMUSCULAR | Status: DC | PRN
  Administered 2012-04-29: .1 mg via INTRATHECAL

## 2012-04-29 MED ORDER — OXYTOCIN 10 UNIT/ML IJ SOLN
INTRAMUSCULAR | Status: DC | PRN
  Administered 2012-04-29: 20 [IU]

## 2012-04-29 MED ORDER — ONDANSETRON HCL 4 MG PO TABS
4.0000 mg | ORAL_TABLET | ORAL | Status: DC | PRN
  Administered 2012-04-29: 4 mg via ORAL
  Filled 2012-04-29: qty 1

## 2012-04-29 MED ORDER — OXYTOCIN 20 UNITS IN LACTATED RINGERS INFUSION - SIMPLE
125.0000 mL/h | INTRAVENOUS | Status: AC
  Filled 2012-04-29: qty 1000

## 2012-04-29 MED ORDER — FENTANYL CITRATE 0.05 MG/ML IJ SOLN: 25.0000 ug | INTRAMUSCULAR | Status: DC | PRN

## 2012-04-29 MED ORDER — SODIUM CHLORIDE 0.9 % IV SOLN
1.0000 ug/kg/h | INTRAVENOUS | Status: DC | PRN
  Filled 2012-04-29: qty 2.5

## 2012-04-29 MED ORDER — KETOROLAC TROMETHAMINE 60 MG/2ML IM SOLN
60.0000 mg | Freq: Once | INTRAMUSCULAR | Status: AC | PRN
  Filled 2012-04-29: qty 2

## 2012-04-29 MED ORDER — IBUPROFEN 600 MG PO TABS
600.0000 mg | ORAL_TABLET | Freq: Four times a day (QID) | ORAL | Status: DC
  Administered 2012-04-29 – 2012-05-02 (×12): 600 mg via ORAL
  Filled 2012-04-29 (×7): qty 1

## 2012-04-29 MED ORDER — LANOLIN HYDROUS EX OINT: 1.0000 "application " | TOPICAL_OINTMENT | CUTANEOUS | Status: DC | PRN

## 2012-04-29 MED ORDER — ONDANSETRON HCL 4 MG/2ML IJ SOLN
INTRAMUSCULAR | Status: DC | PRN
  Administered 2012-04-29: 4 mg via INTRAVENOUS

## 2012-04-29 MED ORDER — DIPHENHYDRAMINE HCL 25 MG PO CAPS: 25.0000 mg | ORAL_CAPSULE | Freq: Four times a day (QID) | ORAL | Status: DC | PRN

## 2012-04-29 MED ORDER — DIPHENHYDRAMINE HCL 50 MG/ML IJ SOLN: 12.5000 mg | INTRAMUSCULAR | Status: DC | PRN

## 2012-04-29 MED ORDER — ONDANSETRON HCL 4 MG/2ML IJ SOLN: 4.0000 mg | INTRAMUSCULAR | Status: DC | PRN

## 2012-04-29 MED ORDER — NALOXONE HCL 0.4 MG/ML IJ SOLN: 0.4000 mg | INTRAMUSCULAR | Status: DC | PRN

## 2012-04-29 MED ORDER — PRENATAL MULTIVITAMIN CH
1.0000 | ORAL_TABLET | Freq: Every day | ORAL | Status: DC
  Administered 2012-04-30 – 2012-05-01 (×2): 1 via ORAL
  Filled 2012-04-29 (×2): qty 1

## 2012-04-29 MED ORDER — MUPIROCIN CALCIUM 2 % NA OINT
1.0000 "application " | TOPICAL_OINTMENT | Freq: Two times a day (BID) | NASAL | Status: DC
  Administered 2012-04-29 – 2012-05-01 (×4): 1 via NASAL
  Filled 2012-04-29 (×33): qty 1

## 2012-04-29 MED ORDER — LACTATED RINGERS IV SOLN
INTRAVENOUS | Status: DC
  Administered 2012-04-29 (×3): via INTRAVENOUS

## 2012-04-29 MED ORDER — LEVOTHYROXINE SODIUM 125 MCG PO TABS
125.0000 ug | ORAL_TABLET | Freq: Every day | ORAL | Status: DC
  Administered 2012-04-30 – 2012-05-02 (×3): 125 ug via ORAL
  Filled 2012-04-29 (×4): qty 1

## 2012-04-29 SURGICAL SUPPLY — 31 items
CLOTH BEACON ORANGE TIMEOUT ST (SAFETY) ×2 IMPLANT
CONTAINER PREFILL 10% NBF 15ML (MISCELLANEOUS) IMPLANT
DRESSING TELFA 8X3 (GAUZE/BANDAGES/DRESSINGS) IMPLANT
DRSG COVADERM 4X10 (GAUZE/BANDAGES/DRESSINGS) ×1 IMPLANT
ELECT REM PT RETURN 9FT ADLT (ELECTROSURGICAL) ×2
ELECTRODE REM PT RTRN 9FT ADLT (ELECTROSURGICAL) ×1 IMPLANT
EXTRACTOR VACUUM M CUP 4 TUBE (SUCTIONS) IMPLANT
GAUZE SPONGE 4X4 12PLY STRL LF (GAUZE/BANDAGES/DRESSINGS) ×4 IMPLANT
GLOVE BIO SURGEON STRL SZ7.5 (GLOVE) ×4 IMPLANT
GOWN PREVENTION PLUS LG XLONG (DISPOSABLE) ×4 IMPLANT
GOWN PREVENTION PLUS XLARGE (GOWN DISPOSABLE) ×2 IMPLANT
KIT ABG SYR 3ML LUER SLIP (SYRINGE) IMPLANT
NDL HYPO 25X1 1.5 SAFETY (NEEDLE) ×1 IMPLANT
NDL HYPO 25X5/8 SAFETYGLIDE (NEEDLE) IMPLANT
NEEDLE HYPO 25X1 1.5 SAFETY (NEEDLE) ×2 IMPLANT
NEEDLE HYPO 25X5/8 SAFETYGLIDE (NEEDLE) IMPLANT
NS IRRIG 1000ML POUR BTL (IV SOLUTION) ×2 IMPLANT
PACK C SECTION WH (CUSTOM PROCEDURE TRAY) ×2 IMPLANT
PAD ABD 7.5X8 STRL (GAUZE/BANDAGES/DRESSINGS) IMPLANT
SLEEVE SCD COMPRESS KNEE MED (MISCELLANEOUS) ×1 IMPLANT
STAPLER VISISTAT 35W (STAPLE) ×2 IMPLANT
SUT MNCRL 0 VIOLET CTX 36 (SUTURE) ×2 IMPLANT
SUT MON AB 2-0 CT1 27 (SUTURE) ×2 IMPLANT
SUT MON AB-0 CT1 36 (SUTURE) ×5 IMPLANT
SUT MONOCRYL 0 CTX 36 (SUTURE) ×2
SUT PLAIN 0 NONE (SUTURE) IMPLANT
SUT PLAIN 2 0 XLH (SUTURE) IMPLANT
SYR CONTROL 10ML LL (SYRINGE) ×2 IMPLANT
TOWEL OR 17X24 6PK STRL BLUE (TOWEL DISPOSABLE) ×4 IMPLANT
TRAY FOLEY CATH 14FR (SET/KITS/TRAYS/PACK) ×2 IMPLANT
WATER STERILE IRR 1000ML POUR (IV SOLUTION) ×2 IMPLANT

## 2012-04-29 NOTE — Anesthesia Postprocedure Evaluation (Signed)
Anesthesia Post Note  Patient: Tracey Nichols  Procedure(s) Performed: Procedure(s) (LRB): CESAREAN SECTION (N/A)  Anesthesia type: Spinal  Patient location: PACU  Post pain: Pain level controlled  Post assessment: Post-op Vital signs reviewed  Last Vitals:  Filed Vitals:   04/29/12 1215  BP: 109/76  Pulse: 60  Temp: 36.8 C  Resp: 16    Post vital signs: Reviewed  Level of consciousness: awake  Complications: No apparent anesthesia complications

## 2012-04-29 NOTE — Op Note (Signed)
Cesarean Section Procedure Note  Indications: malpresentation: twin B Twin gestation Active labor Previous Cesarean section  Pre-operative Diagnosis: 37 week 2 day pregnancy.  Post-operative Diagnosis: same  Surgeon: Lenoard Aden   Assistants: Fredric Mare, CNM  Anesthesia: Local anesthesia 0.25.% bupivacaine and Spinal anesthesia  ASA Class: 2  Procedure Details  The patient was seen in the Holding Room. The risks, benefits, complications, treatment options, and expected outcomes were discussed with the patient.  The patient concurred with the proposed plan, giving informed consent. The risks of anesthesia, infection, bleeding and possible injury to other organs discussed. Injury to bowel, bladder, or ureter with possible need for repair discussed. Possible need for transfusion with secondary risks of hepatitis or HIV acquisition discussed. Post operative complications to include but not limited to DVT, PE and Pneumonia noted. The site of surgery properly noted/marked. The patient was taken to Operating Room # 9, identified as FOYE HAGGART and the procedure verified as C-Section Delivery. A Time Out was held and the above information confirmed.  After induction of anesthesia, the patient was draped and prepped in the usual sterile manner. A Pfannenstiel incision was made and carried down through the subcutaneous tissue to the fascia. Fascial incision was made and extended transversely using Mayo scissors. The fascia was separated from the underlying rectus tissue superiorly and inferiorly. The peritoneum was identified and entered. Peritoneal incision was extended longitudinally. The utero-vesical peritoneal reflection was incised transversely and the bladder flap was bluntly freed from the lower uterine segment. A low transverse uterine incision(Kerr hysterotomy) was made. Delivered from vtx presentation was a  Female twin A with Apgar scores of 8 at one minute and 9 at five minutes.Delivered  from transverse converted to footing breech presentation was a  Female twin B with Apgar scores of 8 at one minute and 9 at five minutes.  Bulb suctioning gently performed. Neonatal team in attendance.After the umbilical cord was clamped and cut cord blood was obtained for evaluation. The placenta was removed intact and appeared normal. The uterus was curetted with a dry lap pack. Good hemostasis was noted.The uterine outline, tubes and ovaries appeared normal. The uterine incision was closed with running locked sutures of 0 Monocryl x 2 layers. Hemostasis was observed. Lavage was carried out until clear.The parietal peritoneum was closed with a running 2-0 Monocryl suture. The fascia was then reapproximated with running sutures of 0 Monocryl. The skin was reapproximated with staples.  Instrument, sponge, and needle counts were correct prior the abdominal closure and at the conclusion of the case.   Findings: above  Estimated Blood Loss:  500         Drains: foley                 Specimens: placenta to path                 Complications:  None; patient tolerated the procedure well.         Disposition: PACU - hemodynamically stable.         Condition: stable  Attending Attestation: I performed the procedure.

## 2012-04-29 NOTE — Transfer of Care (Signed)
Immediate Anesthesia Transfer of Care Note  Patient: Tracey Nichols  Procedure(s) Performed: Procedure(s) (LRB): CESAREAN SECTION (N/A)  Patient Location: PACU  Anesthesia Type: Spinal  Level of Consciousness: awake, alert  and oriented  Airway & Oxygen Therapy: Patient Spontanous Breathing  Post-op Assessment: Report given to PACU RN and Post -op Vital signs reviewed and stable  Post vital signs: stable  Complications: No apparent anesthesia complications

## 2012-04-29 NOTE — MAU Note (Addendum)
Patient seen and examined. Consent witnessed and signed. No changes noted. Update completed. 

## 2012-04-29 NOTE — Anesthesia Procedure Notes (Signed)
Spinal  Patient location during procedure: OR Start time: 04/29/2012 10:20 AM Staffing Performed by: anesthesiologist  Preanesthetic Checklist Completed: patient identified, site marked, surgical consent, pre-op evaluation, timeout performed, IV checked, risks and benefits discussed and monitors and equipment checked Spinal Block Patient position: sitting Prep: site prepped and draped and DuraPrep Patient monitoring: heart rate, cardiac monitor, continuous pulse ox and blood pressure Approach: midline Location: L3-4 Injection technique: single-shot Needle Needle type: Sprotte  Needle gauge: 24 G Needle length: 9 cm Assessment Sensory level: T4 Additional Notes Clear free flow CSF on first attempt.  No paresthesia.  Patient tolerated procedure well.

## 2012-04-29 NOTE — H&P (Signed)
  OB ADMISSION/ HISTORY & PHYSICAL:  Admission Date: 04/29/2012  8:45 AM  Admit Diagnosis: twin latent labor with FHR decelerations Twin A - repetitive variables  Tracey Nichols is a 40 y.o. female presenting for contractions.  Prenatal History: G2P1001   EDC : 05/18/2012, by Last Menstrual Period  Prenatal care at [facility] Primary ob- Dr Seymour Bars Prenatal course complicated by previous CS - planned repeat / hypothyroidism / chronic hypertension stable on Aldomet / Rheumatoid arthritis Twins - di/di  Prenatal Labs: ABO, Rh: A (10/17 0000)  Antibody: Negative (10/17 0000) Rubella: Immune (10/17 0000)  RPR: NON REACTIVE (05/03 1112)  HBsAg: Negative (10/17 0000)  HIV: Non-reactive (10/17 0000)  GBS:  positive 1 hr Glucola :nl   Medical / Surgical History :  Past medical history:  Past Medical History  Diagnosis Date  . Hyperthyroidism     Had hyperthyroidism in first pregnancy, subsequently treated with radioiodine  . Hypothyroidism     s/p radioiodine treatment for hyperthyroidism  . Rheumatoid arthritis   . Hypertension     since age 57- usually ok when pregnant  . Anemia     with 1st pregnancy  . GERD (gastroesophageal reflux disease) 2003    years ago - no meds now     Past surgical history:  Past Surgical History  Procedure Date  . Cesarean section      Family History: History reviewed. No pertinent family history.   Social History:  reports that she has never smoked. She has never used smokeless tobacco. She reports that she does not drink alcohol or use illicit drugs.   Allergies: Propylthiouracil; Wellbutrin; and Sulfa antibiotics    Current Medications at time of admission:  Aldomet 250 mg daily - yesterday Synthroid 125 mcg daily - this am  Review of Systems: Onset of ctx  Physical Exam:  Dilation: 2.5 Effacement (%): 90 Station: 0 Exam by:: S. Carrera, RNC  VS: 98 - 72- 20 - 143/88  General: uncomfortable with  ctx Heart:RR Lungs: Unlabored / clear Abdomen:gravid / non-tender Extremities: trace edema   FHR: FHR A - 140 variable decels to nadir 90s - repetitive / FHR B - 145 no decels TOCO: ctx every 2-4 minutes moderate   Assessment: 37 weeks TWINS - latent labor Repetitive FHR decels - variable decels Twin A  Plan:  Admit Repeat cesarean section - urgent  Marlinda Mike 04/29/2012, 9:34 AM

## 2012-04-29 NOTE — Anesthesia Preprocedure Evaluation (Signed)
Anesthesia Evaluation  Patient identified by MRN, date of birth, ID band Patient awake    Reviewed: Allergy & Precautions, H&P , NPO status , Patient's Chart, lab work & pertinent test results, reviewed documented beta blocker date and time   History of Anesthesia Complications Negative for: history of anesthetic complications  Airway Mallampati: I TM Distance: >3 FB Neck ROM: full    Dental  (+) Teeth Intact   Pulmonary neg pulmonary ROS,  breath sounds clear to auscultation        Cardiovascular hypertension (since age 80, just recently started on aldomet), Rhythm:regular Rate:Normal     Neuro/Psych negative neurological ROS  negative psych ROS   GI/Hepatic Neg liver ROS, GERD- (occasional with pregnancy, took tums)  Controlled,  Endo/Other  Hypothyroidism (s/p radioactive iodine for hyperthyroidism, now on synthroid) Hyperthyroidism (s/p radioactive iodine ablation)   Renal/GU negative Renal ROS  negative genitourinary   Musculoskeletal  (+) Arthritis - (no meds currently, stopped plaquenil with pregnancy.  Bilateral wrists most affected, shoulders), Rheumatoid disorders,    Abdominal   Peds  Hematology negative hematology ROS (+)   Anesthesia Other Findings Last ate at 11 pm  Reproductive/Obstetrics (+) Pregnancy (h/o c/s x1, now with twins and in labor)                           Anesthesia Physical Anesthesia Plan  ASA: II and Emergent  Anesthesia Plan: Epidural   Post-op Pain Management:    Induction:   Airway Management Planned:   Additional Equipment:   Intra-op Plan:   Post-operative Plan:   Informed Consent: I have reviewed the patients History and Physical, chart, labs and discussed the procedure including the risks, benefits and alternatives for the proposed anesthesia with the patient or authorized representative who has indicated his/her understanding and acceptance.     Dental Advisory Given  Plan Discussed with: Surgeon and CRNA  Anesthesia Plan Comments:         Anesthesia Quick Evaluation

## 2012-04-30 ENCOUNTER — Inpatient Hospital Stay (HOSPITAL_COMMUNITY): Admission: RE | Admit: 2012-04-30 | Payer: 59 | Source: Ambulatory Visit | Admitting: Obstetrics & Gynecology

## 2012-04-30 ENCOUNTER — Encounter (HOSPITAL_COMMUNITY): Admission: RE | Payer: Self-pay | Source: Ambulatory Visit

## 2012-04-30 ENCOUNTER — Encounter (HOSPITAL_COMMUNITY): Payer: Self-pay

## 2012-04-30 DIAGNOSIS — O10919 Unspecified pre-existing hypertension complicating pregnancy, unspecified trimester: Secondary | ICD-10-CM

## 2012-04-30 DIAGNOSIS — D62 Acute posthemorrhagic anemia: Secondary | ICD-10-CM

## 2012-04-30 DIAGNOSIS — E039 Hypothyroidism, unspecified: Secondary | ICD-10-CM | POA: Diagnosis present

## 2012-04-30 DIAGNOSIS — O99280 Endocrine, nutritional and metabolic diseases complicating pregnancy, unspecified trimester: Secondary | ICD-10-CM

## 2012-04-30 HISTORY — DX: Hypothyroidism, unspecified: E03.9

## 2012-04-30 HISTORY — DX: Unspecified pre-existing hypertension complicating pregnancy, unspecified trimester: O10.919

## 2012-04-30 HISTORY — DX: Hypothyroidism, unspecified: O99.280

## 2012-04-30 HISTORY — DX: Acute posthemorrhagic anemia: D62

## 2012-04-30 LAB — CBC
MCH: 31.5 pg (ref 26.0–34.0)
MCV: 92.7 fL (ref 78.0–100.0)
Platelets: 186 10*3/uL (ref 150–400)
RBC: 2.73 MIL/uL — ABNORMAL LOW (ref 3.87–5.11)
RDW: 15.4 % (ref 11.5–15.5)

## 2012-04-30 SURGERY — Surgical Case
Anesthesia: Spinal

## 2012-04-30 MED ORDER — POLYSACCHARIDE IRON COMPLEX 150 MG PO CAPS
150.0000 mg | ORAL_CAPSULE | Freq: Two times a day (BID) | ORAL | Status: DC
  Administered 2012-04-30 – 2012-05-02 (×5): 150 mg via ORAL
  Filled 2012-04-30 (×5): qty 1

## 2012-04-30 MED ORDER — DOCUSATE SODIUM 100 MG PO CAPS
100.0000 mg | ORAL_CAPSULE | Freq: Two times a day (BID) | ORAL | Status: DC
  Administered 2012-04-30 – 2012-05-02 (×5): 100 mg via ORAL
  Filled 2012-04-30 (×5): qty 1

## 2012-04-30 NOTE — Progress Notes (Signed)
Subjective: POD# 1 Information for the patient's newborn:  Tracey Nichols, Tracey Nichols [[id number]]  female Information for the patient's newborn:  Tracey Nichols, Tracey Nichols [[id number]]  female   Reports feeling well. Feeding: breast Patient reports tolerating PO.  Breast symptoms: none Pain controlled with Motrin and percocet Denies HA/SOB/C/P/N/V/dizziness. Flatus absent. She reports vaginal bleeding as normal, without clots.  She is ambulating, urinating without difficulty x 1.     Objective:   VS:  Filed Vitals:   04/29/12 2015 04/29/12 2215 04/30/12 0215 04/30/12 0615  BP: 142/92 147/90 135/83 124/83  Pulse: 65 64 71 73  Temp:  98 F (36.7 C) 98 F (36.7 C) 97.8 F (36.6 C)  TempSrc:   Axillary Oral  Resp: 18 18 18 18   Height:      Weight:      SpO2: 98% 99% 96% 96%     Intake/Output Summary (Last 24 hours) at 04/30/12 0902 Last data filed at 04/30/12 4010  Gross per 24 hour  Intake 3723.33 ml  Output   4025 ml  Net -301.67 ml        Basename 04/30/12 0515 04/29/12 0945  WBC 13.0* 9.6  HGB 8.6* 12.0  HCT 25.3* 35.9*  PLT 186 224     Blood type: A/Positive/-- (10/17 0000)  Rubella: Immune (10/17 0000)     Physical Exam:  General: alert, cooperative and no distress CV: Regular rate and rhythm Resp: clear Abdomen: soft, nontender, normal bowel sounds Incision: clean, dry, intact and dressing in place Uterine Fundus: firm, below umbilicus, nontender Lochia: minimal Ext: Homans sign is negative, no sign of DVT and no edema, redness or tenderness in the calves or thighs      Assessment/Plan: 41 y.o.   POD# 1.  s/p Cesarean Delivery.  Indications: Twins, SROM, non reasssuring FHT twin A, malpresentation twin B                Principal Problem:  *PP care - s/p C/S 5/7 - twins Active Problems:  Acute blood loss anemia - stable  Hypothyroidism complicating pregnancy - stable on Synthroid   Chronic hypertension in obstetric context - stable on Aldomet  Doing well,  stable.    Oral Fe supplement and stool softener started           Ambulate, warm po fluids to increase gut motility Routine post-op care  Kiaira Pointer 04/30/2012, 9:02 AM

## 2012-04-30 NOTE — Addendum Note (Signed)
Addendum  created 04/30/12 1914 by Jhonnie Garner, CRNA   Modules edited:Notes Section

## 2012-04-30 NOTE — Anesthesia Postprocedure Evaluation (Signed)
Anesthesia Post Note  Patient: Tracey Nichols  Procedure(s) Performed: Procedure(s) (LRB): CESAREAN SECTION (N/A)  Anesthesia type: SAB  Patient location: Mother/Baby  Post pain: Pain level controlled  Post assessment: Post-op Vital signs reviewed  Last Vitals:  Filed Vitals:   04/30/12 0615  BP: 124/83  Pulse: 73  Temp: 36.6 C  Resp: 18    Post vital signs: Reviewed  Level of consciousness: awake  Complications: No apparent anesthesia complications

## 2012-05-01 NOTE — Progress Notes (Signed)
Patient ID: Tracey Nichols, female   DOB: [birth year], 41 y.o.   MRN: [id number]  POSTOPERATIVE DAY # 2 S/P cesarean section for twins   S:         Reports feeling well             Tolerating po intake / no  nausea / no  vomiting / + flatus / no BM             Bleeding is light             Pain controlled with motrin and percocet             Up ad lib / ambulatory  Newborn breast-feeding Twins / female x2   O:  A & O x 3 NAD             VS: Blood pressure 134/87, pulse 69, temperature 98.8 F (37.1 C), temperature source Oral, resp. rate 18, height 5' 2.5" (1.588 m), weight 77.565 kg (171 lb), last menstrual period 08/12/2011, SpO2 98.00%, unknown if currently breastfeeding.  Lungs: Clear and unlabored  Heart: regular rate and rhythm / no mumurs  Abdomen: soft, non-tender, non-distended, active bowel sounds             Fundus: firm, non-tender, U-1             Dressing OFF              Incision:  approximated with staples / no erythema / no ecchymosis / no drainage  Perineum: no edema  Lochia: light spotting  Extremities: trace edema, no calf pain or tenderness, negative Homans  A:        POD # 2 S/P cesarean section for twins            Mild acute blood loss anemia            Hypothyroidism - stable            + MRSA screen  P:        Routine postoperative care              Continue ordered meds             Contact precautions for + MRSA screen - completed topical and nasal treatments             Anticipate discharge tomorrow     Marlinda Mike 05/01/2012, 10:35 AM

## 2012-05-02 MED ORDER — POLYSACCHARIDE IRON COMPLEX 150 MG PO CAPS: 150.0000 mg | ORAL_CAPSULE | Freq: Two times a day (BID) | ORAL | Status: DC

## 2012-05-02 MED ORDER — DSS 100 MG PO CAPS: 100.0000 mg | ORAL_CAPSULE | Freq: Two times a day (BID) | ORAL | Status: AC | PRN

## 2012-05-02 MED ORDER — IBUPROFEN 600 MG PO TABS: 600.0000 mg | ORAL_TABLET | Freq: Four times a day (QID) | ORAL | Status: AC

## 2012-05-02 MED ORDER — OXYCODONE-ACETAMINOPHEN 5-325 MG PO TABS: 1.0000 | ORAL_TABLET | Freq: Four times a day (QID) | ORAL | Status: AC | PRN

## 2012-05-02 NOTE — Progress Notes (Signed)
Patient ID: Tracey Nichols, female   DOB: 1971/10/11, 41 y.o.   MRN: [id number] POD # 3  Subjective: Pt reports feeling well and eager for d/c home/ Pain controlled with ibuprofen and percocet Tolerating po/Voiding without problems/ No n/v/Flatus pos Activity: out of bed and ambulate Bleeding is spotting Newborn info:  Information for the patient's newborn:  Konstance, Happel [[id number]]  female Information for the patient's newborn:  Leeya, Rusconi [[id number]]  female / Feeding: breast   Objective: VS: Blood pressure 134/82, pulse 81, temperature 97.7 F (36.5 C), temperature source Oral, resp. rate 18  LABS:  Basename 04/30/12 0515 04/29/12 0945  WBC 13.0* 9.6  HGB 8.6* 12.0  HCT 25.3* 35.9*  PLT 186 224     Physical Exam:  General: alert, cooperative and no distress CV: Regular rate and rhythm Resp: clear Abdomen: soft, nontender, normal bowel sounds Incision: healing well, no drainage, well approximated Uterine Fundus: firm, below umbilicus, nontender Lochia: minimal Ext: Homans sign is negative, no sign of DVT and no edema, redness or tenderness in the calves or thighs    A/P: POD # 3/ G2P2003/ S/P C/Section d/t Twins/Repeat ABL Anemia Doing well and stable for discharge home Staple removal in the office on 05/06/12 @ 1130 RX's: Ibuprofen 600mg  po Q 6 hrs prn pain #30 Refill x 1 Percocet 5/325 1 - 2 tabs po every 6 hrs prn pain  #30 No refill Niferex 150mg  po QD/BID #30/#60 Refill x 1    Signed: Demetrius Revel, MSN, [facility] 05/02/2012, 9:21 AM

## 2012-05-02 NOTE — Plan of Care (Signed)
Problem: Discharge Progression Outcomes Goal: Remove staples per MD order Outcome: Not Applicable Date Met:  05/02/12 To be removed in the office.

## 2012-05-02 NOTE — Discharge Summary (Signed)
Obstetric Discharge Summary Reason for Admission: onset of labor and Planned Repeat C/S, Twin gestation @ 37 weeks Prenatal Procedures: ultrasound Intrapartum Procedures: cesarean: low cervical, transverse Postpartum Procedures: none Complications-Operative and Postpartum: none Hemoglobin  Date Value Range Status  04/30/2012 8.6* 12.0-15.0 (g/dL) Final     DELTA CHECK NOTED     REPEATED TO VERIFY     HCT  Date Value Range Status  04/30/2012 25.3* 36.0-46.0 (%) Final    Physical Exam:  General: alert, cooperative and no distress Lochia: appropriate Uterine Fundus: firm Incision: healing well, no significant erythema, staples intact DVT Evaluation: No evidence of DVT seen on physical exam.  Discharge Diagnoses: Term Pregnancy-delivered and Twin gestation  Discharge Information: Date: 05/02/2012 Activity: pelvic rest Diet: routine Medications: Ibuprofen, Colace, Iron and Percocet Condition: stable Instructions: refer to practice specific booklet and return for staple removal on 05/06/12 Discharge to: home   Newborn Data: Born on 04/29/12   Jazara, Swiney Girl Sheena [[id number]]  Live born female  Birth Weight: 4 lb 15.9 oz (2265 g) APGAR: 8, 9   Vanilla, [place] [[id number]]  Live born female  Birth Weight: 6 lb 9.3 oz (2985 g) APGAR: 9, 9  Home with mother.  Malaina Mortellaro K 05/02/2012, 9:34 AM

## 2013-01-08 ENCOUNTER — Other Ambulatory Visit: Payer: Self-pay | Admitting: Family Medicine

## 2013-01-08 DIAGNOSIS — Z1231 Encounter for screening mammogram for malignant neoplasm of breast: Secondary | ICD-10-CM

## 2013-02-04 ENCOUNTER — Ambulatory Visit
Admission: RE | Admit: 2013-02-04 | Discharge: 2013-02-04 | Disposition: A | Discharge status: Home / Self Care | Payer: 59 | Source: Ambulatory Visit | Attending: Family Medicine | Admitting: Family Medicine

## 2013-02-04 DIAGNOSIS — Z1231 Encounter for screening mammogram for malignant neoplasm of breast: Secondary | ICD-10-CM

## 2013-10-13 ENCOUNTER — Ambulatory Visit (INDEPENDENT_AMBULATORY_CARE_PROVIDER_SITE_OTHER): Payer: 59 | Admitting: Internal Medicine

## 2013-10-13 ENCOUNTER — Encounter: Payer: Self-pay | Admitting: Internal Medicine

## 2013-10-13 VITALS — BP 118/72 | HR 85 | Ht 62.0 in | Wt 132.6 lb

## 2013-10-13 DIAGNOSIS — R0600 Dyspnea, unspecified: Secondary | ICD-10-CM

## 2013-10-13 DIAGNOSIS — R0609 Other forms of dyspnea: Secondary | ICD-10-CM

## 2013-10-13 DIAGNOSIS — M069 Rheumatoid arthritis, unspecified: Secondary | ICD-10-CM

## 2013-10-13 NOTE — Patient Instructions (Signed)
Several potential reasons discussed for shortness of breath Do PFT breathing test Do walk test for oxygen levels Depending on these results  - CT scan chest  - methacholine challenge test for asthma  - Pulmonary bike stress test Followup   - be in touch on phone call as we go through tests, [phone]

## 2013-10-13 NOTE — Progress Notes (Signed)
Subjective:    Patient ID: Tracey Nichols, female    DOB: 07-Dec-1971, 42 y.o.   MRN: [id number] PCP Beverley Fiedler, MD Referred by Dr Corliss Skains  Endocrine - Dr Talmage Nap HPI  Baseline autoimmune disease with rheumatoid arthritis. CCP positive. Also with hypothyroidism and hypertension  - Diagnosed with rheumatoid arthritis in 2004 at age 8. Initial arthritis involving only the risks and the fingers. Was on methotrexate from 2002 to 2006 subsequently on Plaquenil. Has had prior admissions with pregnancy but subsequent flareups. Has been on Plaquenil in the past. Currently on Humira for the last 2 years or so with progressive disease involving the ankles as confirmed on ultrasound recently in 2014. Switch to  [facility] being contemplate but rheumatologist wants pulmonary issues sorted out first  Current Issue - Reports dyspnea for the last 1 year. Insidious onset. Occurs randomly and not necessarily with exertion. Has had relief with periodic and occasional use of Klonopin but she does not feel anxious. Occurs at rest without any provoking factors. Does not describe any panic symptoms. Endocrinologist lower her antihypertensives and Synthroid and this did transiently improved dyspnea but did not resolve it. Walk test on room air 185 feet x3 laps. Resting pulse ox is 99%. Lowest pulse ox was 93%. Did not desaturate. Heart rate response was normal. There is no associated cough, wheeze, orthopnea, edema, paroxysmal nocturnal dyspnea. Pulmonary embolism risk factors reviewed and there are none. She is not on any hormone drugs or tamoxifen or cancer history or have any devices. She is not sedentary. No history of prolonged air travel     -      Past Medical History  Diagnosis Date  . Hyperthyroidism     Had hyperthyroidism in first pregnancy, subsequently treated with radioiodine  . Hypothyroidism     s/p radioiodine treatment for hyperthyroidism  . Rheumatoid arthritis(714.0)   . Hypertension    since age 34- usually ok when pregnant  . Anemia     with 1st pregnancy  . GERD (gastroesophageal reflux disease) 2003    years ago - no meds now  . PP care - s/p C/S 5/7 - twins 04/29/2012  . Acute blood loss anemia 04/30/2012  . Hypothyroidism complicating pregnancy 04/30/2012  . Chronic hypertension in obstetric context 04/30/2012     Family History  Problem Relation Age of Onset  . Asthma Brother   . Heart disease      both sets of grandparents  . Rheum arthritis Sister   . Lung cancer Paternal Grandfather      History   Social History  . Marital Status: Married    Spouse Name: N/A    Number of Children: N/A  . Years of Education: N/A   Occupational History  . Not on file.   Social History Main Topics  . Smoking status: Former Smoker -- 0.25 packs/day for 12 years    Types: Cigarettes    Quit date: 12/25/2007  . Smokeless tobacco: Never Used  . Alcohol Use: No  . Drug Use: No  . Sexual Activity: Yes    Birth Control/ Protection: None   Other Topics Concern  . Not on file   Social History Narrative  . No narrative on file     Allergies  Allergen Reactions  . Propylthiouracil Other (See Comments)    Lowered white blood cell count  . Wellbutrin [Bupropion Hcl] Other (See Comments)    seizure  . Sulfa Antibiotics Rash     Outpatient Prescriptions Prior to  Visit  Medication Sig Dispense Refill  . acetaminophen (TYLENOL) 500 MG tablet Take 1,000 mg by mouth daily as needed. pain      . calcium carbonate (TUMS - DOSED IN MG ELEMENTAL CALCIUM) 500 MG chewable tablet Chew 2-3 tablets by mouth daily as needed. Stomach acid      . iron polysaccharides (NIFEREX) 150 MG capsule Take 1 capsule (150 mg total) by mouth 2 (two) times daily.  60 capsule  2  . levothyroxine (SYNTHROID, LEVOTHROID) 125 MCG tablet Take 125 mcg by mouth daily.      . methyldopa (ALDOMET) 250 MG tablet Take 250 mg by mouth daily.      . mupirocin nasal ointment (BACTROBAN) 2 % Place 1 application  into the nose 2 (two) times daily. Use one-half of tube in each nostril twice daily for five (5) days. After application, press sides of nose together and gently massage. Pt finished course of medication on 04/29/12.      . Prenatal Vit-Fe Fumarate-FA (PRENATAL MULTIVITAMIN) TABS Take 1 tablet by mouth daily.       No facility-administered medications prior to visit.      Review of Systems  Constitutional: Negative for fever and unexpected weight change.  HENT: Positive for congestion. Negative for dental problem, ear pain, nosebleeds, postnasal drip, rhinorrhea, sinus pressure, sneezing, sore throat and trouble swallowing.   Eyes: Negative for redness and itching.  Respiratory: Positive for cough and shortness of breath. Negative for chest tightness and wheezing.   Cardiovascular: Negative for palpitations and leg swelling.  Gastrointestinal: Negative for nausea and vomiting.  Genitourinary: Negative for dysuria.  Musculoskeletal: Negative for joint swelling.  Skin: Negative for rash.  Neurological: Negative for headaches.  Hematological: Does not bruise/bleed easily.  Psychiatric/Behavioral: Negative for dysphoric mood. The patient is nervous/anxious.        Objective:   Physical Exam  Vitals reviewed. Constitutional: She is oriented to person, place, and time. She appears well-developed and well-nourished. No distress.  HENT:  Head: Normocephalic and atraumatic.  Right Ear: External ear normal.  Left Ear: External ear normal.  Mouth/Throat: Oropharynx is clear and moist. No oropharyngeal exudate.  Eyes: Conjunctivae and EOM are normal. Pupils are equal, round, and reactive to light. Right eye exhibits no discharge. Left eye exhibits no discharge. No scleral icterus.  Neck: Normal range of motion. Neck supple. No JVD present. No tracheal deviation present. No thyromegaly present.  Cardiovascular: Normal rate, regular rhythm, normal heart sounds and intact distal pulses.  Exam  reveals no gallop and no friction rub.   No murmur heard. Pulmonary/Chest: Effort normal and breath sounds normal. No respiratory distress. She has no wheezes. She has no rales. She exhibits no tenderness.  Abdominal: Soft. Bowel sounds are normal. She exhibits no distension and no mass. There is no tenderness. There is no rebound and no guarding.  Musculoskeletal: Normal range of motion. She exhibits no edema and no tenderness.  RA of hand and wrist  Lymphadenopathy:    She has no cervical adenopathy.  Neurological: She is alert and oriented to person, place, and time. She has normal reflexes. No cranial nerve deficit. She exhibits normal muscle tone. Coordination normal.  Skin: Skin is warm and dry. No rash noted. She is not diaphoretic. No erythema. No pallor.  Psychiatric: She has a normal mood and affect. Her behavior is normal. Judgment and thought content normal.          Assessment & Plan:

## 2013-10-14 DIAGNOSIS — R0600 Dyspnea, unspecified: Secondary | ICD-10-CM | POA: Insufficient documentation

## 2013-10-14 NOTE — Assessment & Plan Note (Signed)
  Dyspnea is unexplained. She is at risk for  - opportunistic infection from humira  - ILD from RA esp being CCP positive - Pulm Htn from RA  - Pleural effusion from RA  On eval at bedside she has no manifestation of above  Plan  - she needs definitive investigation to rule each one of the above out - get PFt,  - CT chest after pft - ECho afer abive - Methacholine challenge and CPST dpeneind on results of above  She will in stay in touch over phon  She is agreeable to plan

## 2013-10-20 ENCOUNTER — Ambulatory Visit (HOSPITAL_COMMUNITY)
Admission: RE | Admit: 2013-10-20 | Discharge: 2013-10-20 | Disposition: A | Discharge status: Home / Self Care | Payer: 59 | Source: Ambulatory Visit | Attending: Internal Medicine | Admitting: Internal Medicine

## 2013-10-20 DIAGNOSIS — R0600 Dyspnea, unspecified: Secondary | ICD-10-CM

## 2013-10-20 DIAGNOSIS — M069 Rheumatoid arthritis, unspecified: Secondary | ICD-10-CM

## 2013-10-20 DIAGNOSIS — R0609 Other forms of dyspnea: Secondary | ICD-10-CM | POA: Insufficient documentation

## 2013-10-20 DIAGNOSIS — R0989 Other specified symptoms and signs involving the circulatory and respiratory systems: Secondary | ICD-10-CM | POA: Insufficient documentation

## 2013-10-20 LAB — PULMONARY FUNCTION TEST

## 2013-10-20 MED ORDER — ALBUTEROL SULFATE (5 MG/ML) 0.5% IN NEBU
2.5000 mg | INHALATION_SOLUTION | Freq: Once | RESPIRATORY_TRACT | Status: AC
  Administered 2013-10-20: 2.5 mg via RESPIRATORY_TRACT

## 2013-10-28 ENCOUNTER — Telehealth: Payer: Self-pay | Admitting: Internal Medicine

## 2013-10-28 DIAGNOSIS — R0600 Dyspnea, unspecified: Secondary | ICD-10-CM

## 2013-10-28 NOTE — Telephone Encounter (Signed)
Let her know that PFTs full normal. SO llung mechanics are good and highly unlikely she has scarring in her lungs from RA. Still, dyspnea unexplained. So, we need to continue the hunt esp in context of RA and immmunomodulators. Next stop is methacholine challenge test (ensure not pregnant or hx of heart attack).  I have ordered it. Please get test arranged ASAP. IF this too is normal, I will do bike pulmonary stress test and based on that consider CXR/CT chest +/- d-dimer   Dr. Kalman Shan, M.D., [facility].C.P Pulmonary and Critical Care Medicine Staff Physician [facility] Pager: [phone], If no answer or between  15:00h - 7:00h: call [phone]  10/28/2013 10:21 PM

## 2013-10-29 NOTE — Telephone Encounter (Signed)
I spoke with the pt and advised of the results. She wants to think about whether she wants to pursue this further. She will call me back and let me know if she wants to proceed with further testing. Carron Curie, CMA

## 2013-11-04 ENCOUNTER — Telehealth: Payer: Self-pay | Admitting: Internal Medicine

## 2013-11-04 NOTE — Telephone Encounter (Signed)
Let her know tis is a very reasonable approach. The odds of something wrong with normal PFT and normal walk test are not zero but low; so totally fine. I Was trying to cautious due to her RA that is all Only thing I would have her do is a CXR which is cheap and she can do with Dr Corliss Skains. She can come back if things are not better or getting worse  AFter conversation, please forward all this to Dr Corliss Skains   Dr. Kalman Shan, M.D., [facility].C.P Pulmonary and Critical Care Medicine Staff Physician [facility] Pager: [phone], If no answer or between  15:00h - 7:00h: call [phone]  11/04/2013 5:00 PM

## 2013-11-06 NOTE — Telephone Encounter (Signed)
Pt aware. Message faxed to Dr. Corliss Skains. Carron Curie, CMA

## 2013-12-31 ENCOUNTER — Telehealth: Payer: Self-pay | Admitting: *Deleted

## 2013-12-31 NOTE — Telephone Encounter (Signed)
Patient phoned requesting test results. Please advise.  Attempted to contact patient to clarify WHAT results she was needing.  No answer on CB# and no voicemail set up yet.  CB# [phone]

## 2014-01-05 ENCOUNTER — Telehealth: Payer: Self-pay | Admitting: Internal Medicine

## 2014-01-05 NOTE — Telephone Encounter (Signed)
Spoke with pt. I advised her of the phone call on 11/04/13. She understands now why she was emailed a RX for CXR by Dr. Shelia Media. She is going to call them and see where she is suppose to get her CXR done. She will call us if anything further is needed

## 2014-01-08 ENCOUNTER — Ambulatory Visit
Admission: RE | Admit: 2014-01-08 | Discharge: 2014-01-08 | Disposition: A | Discharge status: Home / Self Care | Payer: 59 | Source: Ambulatory Visit | Attending: Rheumatology | Admitting: Rheumatology

## 2014-01-08 ENCOUNTER — Other Ambulatory Visit: Payer: Self-pay | Admitting: Rheumatology

## 2014-01-08 DIAGNOSIS — Z5189 Encounter for other specified aftercare: Secondary | ICD-10-CM

## 2014-01-26 ENCOUNTER — Other Ambulatory Visit: Payer: Self-pay

## 2014-01-26 DIAGNOSIS — Z1231 Encounter for screening mammogram for malignant neoplasm of breast: Secondary | ICD-10-CM

## 2014-02-09 ENCOUNTER — Ambulatory Visit: Payer: 59

## 2014-02-22 ENCOUNTER — Ambulatory Visit: Payer: 59

## 2014-03-02 ENCOUNTER — Ambulatory Visit
Admission: RE | Admit: 2014-03-02 | Discharge: 2014-03-02 | Disposition: A | Discharge status: Home / Self Care | Payer: 59 | Source: Ambulatory Visit

## 2014-03-02 DIAGNOSIS — Z1231 Encounter for screening mammogram for malignant neoplasm of breast: Secondary | ICD-10-CM

## 2014-03-21 ENCOUNTER — Encounter: Payer: Self-pay | Admitting: *Deleted

## 2014-10-25 ENCOUNTER — Encounter: Payer: Self-pay | Admitting: *Deleted

## 2014-12-08 ENCOUNTER — Other Ambulatory Visit: Payer: Self-pay | Admitting: Endocrinology

## 2014-12-08 DIAGNOSIS — E049 Nontoxic goiter, unspecified: Secondary | ICD-10-CM

## 2014-12-27 ENCOUNTER — Other Ambulatory Visit: Payer: 59

## 2014-12-30 ENCOUNTER — Encounter: Payer: Self-pay | Admitting: Neurology

## 2014-12-31 ENCOUNTER — Ambulatory Visit (INDEPENDENT_AMBULATORY_CARE_PROVIDER_SITE_OTHER): Payer: BLUE CROSS/BLUE SHIELD | Admitting: Neurology

## 2014-12-31 ENCOUNTER — Encounter: Payer: Self-pay | Admitting: Neurology

## 2014-12-31 VITALS — BP 153/101 | HR 100 | Temp 98.6°F | Ht 62.0 in | Wt 125.0 lb

## 2014-12-31 DIAGNOSIS — Z79899 Other long term (current) drug therapy: Secondary | ICD-10-CM

## 2014-12-31 DIAGNOSIS — R519 Headache, unspecified: Secondary | ICD-10-CM

## 2014-12-31 DIAGNOSIS — G43719 Chronic migraine without aura, intractable, without status migrainosus: Secondary | ICD-10-CM

## 2014-12-31 DIAGNOSIS — R51 Headache: Secondary | ICD-10-CM

## 2014-12-31 DIAGNOSIS — M069 Rheumatoid arthritis, unspecified: Secondary | ICD-10-CM

## 2014-12-31 DIAGNOSIS — G44209 Tension-type headache, unspecified, not intractable: Secondary | ICD-10-CM

## 2014-12-31 DIAGNOSIS — Z79620 Long term (current) use of immunosuppressive biologic: Secondary | ICD-10-CM

## 2014-12-31 MED ORDER — GABAPENTIN 100 MG PO CAPS: ORAL_CAPSULE | ORAL | Status: DC

## 2014-12-31 NOTE — Progress Notes (Signed)
Subjective:    Patient ID: Tracey Nichols is a 44 y.o. female.  HPI     Huston Foley, MD, PhD [facility] [address] [address] [place], [place] [address]  Dear Dr. Barbaraann Barthel,  I saw your patient, Tracey Nichols, upon your kind request in my neurologic clinic today for initial consultation of her recurrent headaches. The patient is accompanied by her husband today. As you know, Tracey Nichols is a 44 year old right-handed woman with an underlying medical history of thyroid disease with radioiodine treatment of hyperthyroidism, then treatment for hypothyroidism (Dr. Talmage Nap), hypertension, rheumatoid arthritis (Dr. Corliss Skains), isolated seizure event, reflux disease, who reports a long-standing history of migraine headaches. She was diagnosed with migraines when she was in her early 70s. She was diagnosed with rheumatoid arthritis in her late 86s. She has been on methotrexate and Plaquenil for her rheumatoid arthritis has been on Humira for the past 5 years or so. Her headache is typically throbbing and she has occasional visual aura. She has occasional nausea and rare vomiting. She has typically photophobia and sonophobia. Lately since Thanksgiving she has had a different type of headache which seems to be posterior in her neck and radiates upwards like an horseshoe-like ring in the back of her head. She has had a daily headache for the past 6 weeks. She was diagnosed with anxiety a long time ago but was reluctant to take medication. She was recently started on Zoloft with low dose and slow titration to up to 100 mg which then caused her mood related side effects. She feels that she has had a chronic headache since she was started on Zoloft. It was scaled back to 50 mg at this time. She was recently given a prescription for Flexeril which she has taken infrequently. Caffeine or Excedrin Migraine helps her headaches. She has reduced her caffeine intake orally. But lately has been  drinking more caffeine. She tries to drink enough water. She endorses stress. She recently got laid off. She has a 54-year-old and a set of 40-year-old twins. She denies any one-sided weakness or numbness. In the past she used to go [facility] the [facility] and was treated with imipramine. She was then given Wellbutrin some years ago for smoking cessation and had the isolated seizure event. She was stopped on Wellbutrin and imipramine at the time. She has taken Maxalt for headache abortive treatment. In the last few weeks she has noticed an increase in her blood pressure. She was treated for high blood pressure for quite some time until she got pregnant. She was diagnosed with hypertension in her 54s. Her sister has been diagnosed with rheumatoid arthritis as well. She has a migraine history in her mother and possibly maternal grandmother. Of note, she snores some but has been able to lose about 10 pounds since she changed her dietary habits. She has woken herself up with a gasping sensation one time. Her mother endorses that she snores. Her husband notices her snoring but he is a heavy sleeper.  She has recently seen an ophthalmologist. She started seeing an ophthalmologist when she was on plaque paternal and her recent exam was normal. She is a little farsighted.  Her Past Medical History Is Significant For: Past Medical History  Diagnosis Date  . Hyperthyroidism     Had hyperthyroidism in first pregnancy, subsequently treated with radioiodine  . Hypothyroidism     s/p radioiodine treatment for hyperthyroidism  . Rheumatoid arthritis(714.0)   . Hypertension  since age 26- usually ok when pregnant  . Anemia     with 1st pregnancy  . GERD (gastroesophageal reflux disease) 2003    years ago - no meds now  . PP care - s/p C/S 5/7 - twins 04/29/2012  . Acute blood loss anemia 04/30/2012  . Hypothyroidism complicating pregnancy 04/30/2012  . 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 Throat pain   . GAD (generalized anxiety disorder)     Her Past Surgical History Is Significant For: Past Surgical History  Procedure Laterality Date  . Cesarean section    . Cesarean section  04/29/2012    Procedure: CESAREAN SECTION;  Surgeon: Lenoard Aden, MD;  Location: [facility];  Service: Gynecology;  Laterality: N/A;  Repeat Cesarean Section; Twins    Her Family History Is Significant For: Family History  Problem Relation Age of Onset  . Asthma Brother   . Heart disease      both sets of grandparents  . Rheum arthritis Sister   . Lung cancer Paternal Grandfather   . Goiter Mother   . Migraines Mother   . Hyperlipidemia Mother   . Hypertension Father     paternal grandmother  . Diabetes Paternal Grandmother   . Arthritis Maternal Grandmother     Her Social History Is Significant For: History   Social History  . Marital Status: Married    Spouse Name: N/A    Number of Children: 3  . Years of Education: N/A   Social History Main Topics  . Smoking status: Former Smoker -- 0.25 packs/day for 12 years    Types: Cigarettes    Quit date: 12/25/2007  . Smokeless tobacco: Never Used  . Alcohol Use: 0.0 oz/week    0 Not specified per week     Comment: 3 glasses of wine weekly  . Drug Use: No  . Sexual Activity: Yes    Birth Control/ Protection: None   Other Topics Concern  . None   Social History Narrative    Her Allergies Are:  Allergies  Allergen Reactions  . Propylthiouracil Other (See Comments)    Lowered white blood cell count  . Wellbutrin [Bupropion Hcl] Other (See Comments)    seizure  . Sulfa Antibiotics Rash  :   Her Current Medications Are:  Outpatient Encounter Prescriptions as of 12/31/2014  Medication Sig  . acetaminophen (TYLENOL) 325 MG tablet Take 650 mg by mouth every 6 (six) hours as needed.  . clonazePAM (KLONOPIN) 1 MG tablet Take 1 mg by mouth at bedtime. As needed  . cyclobenzaprine  (FLEXERIL) 10 MG tablet   . doxycycline (VIBRA-TABS) 100 MG tablet   . fluticasone (FLONASE) 50 MCG/ACT nasal spray Place 2 sprays into both nostrils daily.  Marland Kitchen HUMIRA PEN 40 MG/0.8ML injection Every 2 weeks  . levothyroxine (SYNTHROID, LEVOTHROID) 112 MCG tablet Take 112 mcg by mouth daily before breakfast.  . sertraline (ZOLOFT) 50 MG tablet Take 50 mg by mouth daily.  . [DISCONTINUED] lisinopril (PRINIVIL,ZESTRIL) 2.5 MG tablet Take 2.5 mg by mouth daily.  . [DISCONTINUED] sertraline (ZOLOFT) 100 MG tablet Take 100 mg by mouth daily.  :  Review of Systems:  Out of a complete 14 point review of systems, all are reviewed and negative with the exception of these symptoms as listed below:    Review of Systems  HENT: Positive for trouble swallowing.        Ringing in  ears  Eyes: Positive for pain.  Cardiovascular: Positive for palpitations.  Endocrine: Positive for cold intolerance and heat intolerance.       Flushing, increased thirst  Hematological: Bruises/bleeds easily.       Lymph nodes    Objective:  Neurologic Exam  Physical Exam Physical Examination:   Filed Vitals:   12/31/14 1029  BP: 153/101  Pulse: 100  Temp: 98.6 F (37 C)    General Examination: The patient is a very pleasant 44 y.o. female in no acute distress. She appears well-developed and well-nourished and very well groomed. She is slender. She is mildly anxious appearing.  HEENT: Normocephalic, atraumatic, pupils are equal, round and reactive to light and accommodation. Funduscopic exam is normal with sharp disc margins noted. Extraocular tracking is good without limitation to gaze excursion or nystagmus noted. Normal smooth pursuit is noted. Hearing is grossly intact. Tympanic membranes are clear bilaterally. Face is symmetric with normal facial animation and normal facial sensation. Speech is clear with no dysarthria noted. There is no hypophonia. There is no lip, neck/head, jaw or voice tremor. Neck is  supple with full range of passive and active motion. There are no carotid bruits on auscultation. Oropharynx exam reveals: mild mouth dryness, good dental hygiene and moderate airway crowding, due to larger tonsils and larger uvula. Tongue relatively speaking appears a little larger as well. She has mild pharyngeal erythema and endorses having had a sore throat and sinus infection a couple weeks ago. Mallampati is class II. Tongue protrudes centrally and palate elevates symmetrically.   Chest: Clear to auscultation without wheezing, rhonchi or crackles noted.  Heart: S1+S2+0, regular and normal without murmurs, rubs or gallops noted.   Abdomen: Soft, non-tender and non-distended with normal bowel sounds appreciated on auscultation.  Extremities: There is no pitting edema in the distal lower extremities bilaterally. Pedal pulses are intact.  Skin: Warm and dry without trophic changes noted. There are no varicose veins.  Musculoskeletal: exam reveals no obvious joint deformities, tenderness or joint swelling or erythema.   Neurologically:  Mental status: The patient is awake, alert and oriented in all 4 spheres. Her immediate and remote memory, attention, language skills and fund of knowledge are appropriate. There is no evidence of aphasia, agnosia, apraxia or anomia. Speech is clear with normal prosody and enunciation. Thought process is linear. Mood is normal and affect is normal.  Cranial nerves II - XII are as described above under HEENT exam. In addition: shoulder shrug is normal with equal shoulder height noted. Motor exam: Normal bulk, strength and tone is noted. There is no drift, tremor or rebound. Romberg is negative. Reflexes are 2+ throughout. Babinski: Toes are flexor bilaterally. Fine motor skills and coordination: intact with normal finger taps, normal hand movements, normal rapid alternating patting, normal foot taps and normal foot agility.  Cerebellar testing: No dysmetria or  intention tremor on finger to nose testing. Heel to shin is unremarkable bilaterally. There is no truncal or gait ataxia.  Sensory exam: intact to light touch, pinprick, vibration, temperature sense  in the upper and lower extremities.  Gait, station and balance: She stands easily. No veering to one side is noted. No leaning to one side is noted. Posture is age-appropriate and stance is narrow based. Gait shows normal stride length and normal pace. No problems turning are noted. She turns en bloc. Tandem walk is unremarkable.        Assessment and Plan:   In summary, YAZAIRA SPEAS is a  very pleasant 44 y.o.-year old female with an underlying medical history of thyroid disease with radioiodine treatment of hyperthyroidism, then treatment for hypothyroidism (Dr. Talmage Nap), hypertension, rheumatoid arthritis (Dr. Corliss Skains), isolated seizure event, reflux disease, who presents with a history of daily headaches for the past 6+ weeks. She most likely has a combination of migrainous and tension type headaches. She has a long-standing history of migraine headaches without aura. Her bandlike headache sensation as well as neck pain are more in keeping with tension-type headaches. Sometimes when headaches are long-standing and chronic with daily headache reported it gets to be difficult to tease out what is of migrainous component than what is attention component. She was tried on migraine prevention medication and abortive treatment in the past. Given her rising blood pressure in the recent past I would be reluctant to suggest a triptan. I've asked her to reduce her oral caffeine intake, drink plenty of water and use Excedrin Migraine as needed only. She can continue to use Flexeril as needed but is reminded of the sedating properties of the medication and if she wants to try it during the day she should probably cut it in half. She is wondering whether she should increase her Zoloft. She is advised to discuss this with  you. Potentially she could go back on 75 mg for residual anxiety. From my end of things I would like to suggest a cautious trial of gabapentin. She is somewhat fearful of trying medications and she had the seizure on Wellbutrin in the past. She has never had a brain MRI and I would like to proceed with a brain MRI with and without contrast given her change in headache character and frequency as well as long-standing use of Humira and her history of rheumatoid arthritis. Thankfully, her physical exam is nonfocal and she was reassured in that regard. I gave her a prescription for gabapentin and told her we would call her with her MRI results once they are available. She is advised to titrate slowly with gabapentin starting at 100 mg with incremental increase weekly to up to 300 mg each night. She reports not sleeping well at night and this may help as well.  I had a long chat with the patient and her husband about my findings and the diagnosis of migraine and tension-type headaches, the prognosis and treatment options. We talked about medical treatments and non-pharmacological approaches. We talked about maintaining a healthy lifestyle in general. I encouraged the patient to eat healthy, exercise daily and keep well hydrated, to keep a scheduled bedtime and wake time routine, to not skip any meals and eat healthy snacks in between meals and to have protein with every meal.   I advised the patient about common headache triggers: sleep deprivation, dehydration, overheating, stress, hypoglycemia or skipping meals and blood sugar fluctuations, excessive pain medications or excessive alcohol use or caffeine withdrawal. Some people have food triggers such as aged cheese, orange juice or chocolate, especially dark chocolate, or MSG (monosodium glutamate). She is to try to avoid these headache triggers as much possible. It may be helpful to keep a headache diary to figure out what makes Her headaches worse or brings them on  and what alleviates them. Some people report headache onset after exercise but studies have shown that regular exercise may actually prevent headaches from coming. If She has exercise-induced headaches, She is advised to drink plenty of fluid before and after exercising and that to not overdo it and to not overheat.  As  far as further diagnostic testing is concerned, I suggested the following today: MRI brain w and w/o Gad. As far as medications are concerned, I recommended the following at this time: Gabapentin 100 mg strength one pill each night for 1 week then 2 pills each night for 1 week then 3 pills each night thereafter. I will see her back routinely in a couple of months, sooner if the need arises. She is encouraged to call with any interim questions or concerns.  Thank you very much for allowing me to participate in the care of this nice patient. If I can be of any further assistance to you please do not hesitate to call me at [phone].  Sincerely,   Huston Foley, MD, PhD

## 2014-12-31 NOTE — Patient Instructions (Signed)
Please remember to try to maintain good sleep hygiene, which means: Keep a regular sleep and wake schedule, try not to exercise or have a meal within 2 hours of your bedtime, try to keep your bedroom conducive for sleep, that is, cool and dark, without light distractors such as an illuminated alarm clock, and refrain from watching TV right before sleep or in the middle of the night and do not keep the TV or radio on during the night. Also, try not to use or play on electronic devices at bedtime, such as your cell phone, tablet PC or laptop. If you like to read at bedtime on an electronic device, try to dim the background light as much as possible. Do not eat in the middle of the night.   Please remember, common headache triggers are: sleep deprivation, dehydration, overheating, stress, hypoglycemia or skipping meals and blood sugar fluctuations, excessive pain medications or excessive alcohol use or caffeine withdrawal. Some people have food triggers such as aged cheese, orange juice or chocolate, especially dark chocolate, or MSG (monosodium glutamate). Try to avoid these headache triggers as much possible. It may be helpful to keep a headache diary to figure out what makes your headaches worse or brings them on and what alleviates them. Some people report headache onset after exercise but studies have shown that regular exercise may actually prevent headaches from coming. If you have exercise-induced headaches, please make sure that you drink plenty of fluid before and after exercising and that you do not over do it and do not overheat.  Start Neurontin (gabapentin) 100 mg strength: Take 1 pill nightly at bedtime for 1 week, then 2 pills nightly for 1 week, then 3 pills each night thereafter. The most common side effects reported are sedation or sleepiness. Rare side effects include balance problems, confusion.   You may continue flexeril generic 10 mg prn, but if you take it during the day, it may be  sedating, take 5 mg as needed during the day.   Please ask family and friends or your significant other or bed partner if you snore and if so, how loud it is, and if you have breathing related issues in your sleep, such as: snorting sounds, choking sounds, pauses in your breathing or shallow breathing events. These may be symptoms of obstructive sleep apnea (OSA).

## 2015-01-01 ENCOUNTER — Encounter (HOSPITAL_COMMUNITY): Payer: Self-pay | Admitting: *Deleted

## 2015-01-01 ENCOUNTER — Emergency Department (HOSPITAL_COMMUNITY)
Admission: EM | Admit: 2015-01-01 | Discharge: 2015-01-01 | Disposition: A | Discharge status: Home / Self Care | Payer: BLUE CROSS/BLUE SHIELD | Attending: Emergency Medicine | Admitting: Emergency Medicine

## 2015-01-01 ENCOUNTER — Emergency Department (HOSPITAL_COMMUNITY): Payer: BLUE CROSS/BLUE SHIELD

## 2015-01-01 DIAGNOSIS — M1990 Unspecified osteoarthritis, unspecified site: Secondary | ICD-10-CM | POA: Insufficient documentation

## 2015-01-01 DIAGNOSIS — I10 Essential (primary) hypertension: Secondary | ICD-10-CM | POA: Diagnosis not present

## 2015-01-01 DIAGNOSIS — F419 Anxiety disorder, unspecified: Secondary | ICD-10-CM | POA: Insufficient documentation

## 2015-01-01 DIAGNOSIS — Z79899 Other long term (current) drug therapy: Secondary | ICD-10-CM | POA: Diagnosis not present

## 2015-01-01 DIAGNOSIS — Z862 Personal history of diseases of the blood and blood-forming organs and certain disorders involving the immune mechanism: Secondary | ICD-10-CM | POA: Diagnosis not present

## 2015-01-01 DIAGNOSIS — E039 Hypothyroidism, unspecified: Secondary | ICD-10-CM | POA: Diagnosis not present

## 2015-01-01 DIAGNOSIS — R51 Headache: Secondary | ICD-10-CM | POA: Insufficient documentation

## 2015-01-01 DIAGNOSIS — Z87891 Personal history of nicotine dependence: Secondary | ICD-10-CM | POA: Diagnosis not present

## 2015-01-01 DIAGNOSIS — Z7982 Long term (current) use of aspirin: Secondary | ICD-10-CM | POA: Diagnosis not present

## 2015-01-01 DIAGNOSIS — E0590 Thyrotoxicosis, unspecified without thyrotoxic crisis or storm: Secondary | ICD-10-CM | POA: Insufficient documentation

## 2015-01-01 DIAGNOSIS — R519 Headache, unspecified: Secondary | ICD-10-CM

## 2015-01-01 MED ORDER — PROCHLORPERAZINE EDISYLATE 5 MG/ML IJ SOLN
10.0000 mg | Freq: Once | INTRAMUSCULAR | Status: AC
  Administered 2015-01-01: 10 mg via INTRAVENOUS
  Filled 2015-01-01: qty 2

## 2015-01-01 MED ORDER — SODIUM CHLORIDE 0.9 % IV BOLUS (SEPSIS)
1000.0000 mL | Freq: Once | INTRAVENOUS | Status: AC
Start: 2015-01-01 — End: 2015-01-01
  Administered 2015-01-01: 1000 mL via INTRAVENOUS

## 2015-01-01 MED ORDER — KETOROLAC TROMETHAMINE 30 MG/ML IJ SOLN
30.0000 mg | Freq: Once | INTRAMUSCULAR | Status: AC
  Administered 2015-01-01: 30 mg via INTRAVENOUS
  Filled 2015-01-01: qty 1

## 2015-01-01 MED ORDER — DEXAMETHASONE SODIUM PHOSPHATE 10 MG/ML IJ SOLN
10.0000 mg | Freq: Once | INTRAMUSCULAR | Status: AC
  Administered 2015-01-01: 10 mg via INTRAVENOUS
  Filled 2015-01-01: qty 1

## 2015-01-01 MED ORDER — DIPHENHYDRAMINE HCL 25 MG PO TABS: 25.0000 mg | ORAL_TABLET | Freq: Four times a day (QID) | ORAL | Status: DC

## 2015-01-01 MED ORDER — OXYCODONE-ACETAMINOPHEN 5-325 MG PO TABS
1.0000 | ORAL_TABLET | Freq: Once | ORAL | Status: AC
  Administered 2015-01-01: 1 via ORAL
  Filled 2015-01-01: qty 1

## 2015-01-01 MED ORDER — PROCHLORPERAZINE MALEATE 10 MG PO TABS
10.0000 mg | ORAL_TABLET | Freq: Three times a day (TID) | ORAL | Status: DC | PRN
Start: 2015-01-01 — End: 2015-01-06

## 2015-01-01 MED ORDER — DIPHENHYDRAMINE HCL 50 MG/ML IJ SOLN
25.0000 mg | Freq: Once | INTRAMUSCULAR | Status: AC
  Administered 2015-01-01: 25 mg via INTRAVENOUS
  Filled 2015-01-01: qty 1

## 2015-01-01 MED ORDER — KETOROLAC TROMETHAMINE 10 MG PO TABS
10.0000 mg | ORAL_TABLET | Freq: Four times a day (QID) | ORAL | Status: DC | PRN
Start: 2015-01-01 — End: 2015-01-06

## 2015-01-01 MED ORDER — MAGNESIUM SULFATE 2 GM/50ML IV SOLN
2.0000 g | Freq: Once | INTRAVENOUS | Status: AC
  Administered 2015-01-01: 2 g via INTRAVENOUS
  Filled 2015-01-01: qty 50

## 2015-01-01 NOTE — ED Provider Notes (Signed)
CSN: [id number]     Arrival date & time 01/01/15  1451 History   First MD Initiated Contact with Patient 01/01/15 1747     Chief Complaint  Patient presents with  . Headache     (Consider location/radiation/quality/duration/timing/severity/associated sxs/prior Treatment) Patient is a 44 y.o. female presenting with headaches. The history is provided by the patient and medical records. No language interpreter was used.  Headache Associated symptoms: no abdominal pain, no back pain, no cough, no diarrhea, no fatigue, no fever, no nausea, no neck stiffness and no vomiting      Tracey Nichols is a 44 y.o. female  with a hx of hypothyroid, arthritis, hypertension, anemia, GERD, migraine headache, presents to the Emergency Department complaining of gradual, persistent, progressively worsening headache onset 6 weeks ago. Patient reports worsening of her headache in the last 2 weeks and then again today. Patient has seen her primary care for this and saw her neurologist yesterday. Neurology diagnosed her with a combination of tension and migrainous headaches. Neurology recommended beginning gabapentin. Patient reports she took her first dose last night and slept the night for the first time in many weeks. She reports however that her headache returned with a vengeance this being. Patient has been taking large doses of Excedrin Migraine at home but was informed by her neurologist that she had stopped and therefore took no medication at home prior to arrival today.  Patient endorses associated symptoms of OB and photophobia. She also endorses a mild nausea without vomiting. Patient reports she was prescribed Percocet by the neurologist which did not assist with her headache. She denies fever, chills, neck pain, neck stiffness, chest pain, shortness of breath, abdominal pain, vomiting, diarrhea, weakness, dizziness, syncope, dysuria.    Past Medical History  Diagnosis Date  . Hyperthyroidism     Had  hyperthyroidism in first pregnancy, subsequently treated with radioiodine  . Hypothyroidism     s/p radioiodine treatment for hyperthyroidism  . Rheumatoid arthritis(714.0)   . Hypertension     since age 18- usually ok when pregnant  . Anemia     with 1st pregnancy  . GERD (gastroesophageal reflux disease) 2003    years ago - no meds now  . PP care - s/p C/S 5/7 - twins 04/29/2012  . Acute blood loss anemia 04/30/2012  . Hypothyroidism complicating pregnancy 04/30/2012  . 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 Throat pain   . GAD (generalized anxiety disorder)    Past Surgical History  Procedure Laterality Date  . Cesarean section    . Cesarean section  04/29/2012    Procedure: CESAREAN SECTION;  Surgeon: Lenoard Aden, MD;  Location: [facility];  Service: Gynecology;  Laterality: N/A;  Repeat Cesarean Section; Twins   Family History  Problem Relation Age of Onset  . Asthma Brother   . Heart disease      both sets of grandparents  . Rheum arthritis Sister   . Lung cancer Paternal Grandfather   . Goiter Mother   . Migraines Mother   . Hyperlipidemia Mother   . Hypertension Father     paternal grandmother  . Diabetes Paternal Grandmother   . Arthritis Maternal Grandmother    History  Substance Use Topics  . Smoking status: Former Smoker -- 0.25 packs/day for 12 years    Types: Cigarettes    Quit date: 12/25/2007  . Smokeless tobacco: Never Used  . Alcohol Use:  0.0 oz/week    0 Not specified per week     Comment: 3 glasses of wine weekly   OB History    Gravida Para Term Preterm AB TAB SAB Ectopic Multiple Living   Review of Systems  Constitutional: Negative for fever, diaphoresis, appetite change, fatigue and unexpected weight change.  HENT: Negative for mouth sores.   Eyes: Negative for visual disturbance.  Respiratory: Negative for cough, chest tightness, shortness of breath and wheezing.    Cardiovascular: Negative for chest pain.  Gastrointestinal: Negative for nausea, vomiting, abdominal pain, diarrhea and constipation.  Endocrine: Negative for polydipsia, polyphagia and polyuria.  Genitourinary: Negative for dysuria, urgency, frequency and hematuria.  Musculoskeletal: Negative for back pain and neck stiffness.  Skin: Negative for rash.  Allergic/Immunologic: Negative for immunocompromised state.  Neurological: Positive for headaches. Negative for syncope and light-headedness.  Hematological: Does not bruise/bleed easily.  Psychiatric/Behavioral: Negative for sleep disturbance. The patient is not nervous/anxious.       Allergies  Propylthiouracil; Wellbutrin; and Sulfa antibiotics  Home Medications   Prior to Admission medications   Medication Sig Start Date End Date Taking? Authorizing Provider  acetaminophen (TYLENOL) 650 MG CR tablet Take 1,300 mg by mouth every 8 (eight) hours as needed for pain.    Yes Historical Provider, MD  aspirin-acetaminophen-caffeine (EXCEDRIN MIGRAINE) [phone] MG per tablet Take 2 tablets by mouth every 8 (eight) hours as needed for headache.   Yes Historical Provider, MD  clonazePAM (KLONOPIN) 1 MG tablet Take 1 mg by mouth at bedtime as needed for anxiety.    Yes Historical Provider, MD  cyclobenzaprine (FLEXERIL) 10 MG tablet Take 5 mg by mouth 3 (three) times daily as needed for muscle spasms.  12/27/14  Yes Historical Provider, MD  doxycycline (VIBRA-TABS) 100 MG tablet Take 100 mg by mouth 2 (two) times daily. Started 12/25/13, for 10 days ending 01/03/14 12/26/14  Yes Historical Provider, MD  fluticasone (FLONASE) 50 MCG/ACT nasal spray Place 2 sprays into both nostrils daily.   Yes Historical Provider, MD  gabapentin (NEURONTIN) 100 MG capsule Take 1 pill nightly at bedtime for 1 week, then 2 pills nightly for 1 week, then 3 pills each night thereafter. 12/31/14  Yes Huston Foley, MD  HUMIRA PEN 40 MG/0.8ML injection Inject 40 mg into the  skin every 14 (fourteen) days. Every 2 weeks 09/29/13  Yes Historical Provider, MD  ibuprofen (ADVIL,MOTRIN) 200 MG tablet Take 400 mg by mouth every 6 (six) hours as needed for headache.    Yes Historical Provider, MD  levothyroxine (SYNTHROID, LEVOTHROID) 112 MCG tablet Take 112 mcg by mouth daily before breakfast.   Yes Historical Provider, MD  polyvinyl alcohol (LIQUIFILM TEARS) 1.4 % ophthalmic solution Place 1 drop into both eyes every morning.   Yes Historical Provider, MD  sertraline (ZOLOFT) 50 MG tablet Take 50 mg by mouth daily.   Yes Historical Provider, MD  diphenhydrAMINE (BENADRYL) 25 MG tablet Take 1 tablet (25 mg total) by mouth every 6 (six) hours. 01/01/15   Melisse Caetano, PA-C  ketorolac (TORADOL) 10 MG tablet Take 1 tablet (10 mg total) by mouth every 6 (six) hours as needed. 01/01/15   Hera Celaya, PA-C  prochlorperazine (COMPAZINE) 10 MG tablet Take 1 tablet (10 mg total) by mouth every 8 (eight) hours as needed for nausea or vomiting (Nausea). 01/01/15   Curties Conigliaro, PA-C   BP 164/103 mmHg  Pulse 112  Temp(Src) 98.1 F (36.7 C) (Oral)  Resp 18  Ht 5\' 2"  (1.575 m)  Wt 125 lb (56.7 kg)  BMI 22.86 kg/m2  SpO2 100%  LMP 01/17/2013 Physical Exam  Constitutional: She is oriented to person, place, and time. She appears well-developed and well-nourished. No distress.  HENT:  Head: Normocephalic and atraumatic.  Mouth/Throat: Oropharynx is clear and moist.  Eyes: Conjunctivae and EOM are normal. Pupils are equal, round, and reactive to light. No scleral icterus.  No horizontal, vertical or rotational nystagmus  Neck: Normal range of motion. Neck supple.  Full active and passive ROM without pain No midline or paraspinal tenderness No nuchal rigidity or meningeal signs  Cardiovascular: Normal rate, regular rhythm, normal heart sounds and intact distal pulses.   No murmur heard. Pulmonary/Chest: Effort normal and breath sounds normal. No respiratory  distress. She has no wheezes. She has no rales.  Abdominal: Soft. Bowel sounds are normal. There is no tenderness. There is no rebound and no guarding.  Musculoskeletal: Normal range of motion.  Lymphadenopathy:    She has no cervical adenopathy.  Neurological: She is alert and oriented to person, place, and time. She has normal reflexes. No cranial nerve deficit. She exhibits normal muscle tone. Coordination normal.  Mental Status:  Alert, oriented, thought content appropriate. Speech fluent without evidence of aphasia. Able to follow 2 step commands without difficulty.  Cranial Nerves:  II:  Peripheral visual fields grossly normal, pupils equal, round, reactive to light III,IV, VI: ptosis not present, extra-ocular motions intact bilaterally  V,VII: smile symmetric, facial light touch sensation equal VIII: hearing grossly normal bilaterally  IX,X: gag reflex present  XI: bilateral shoulder shrug equal and strong XII: midline tongue extension  Motor:  5/5 in upper and lower extremities bilaterally including strong and equal grip strength and dorsiflexion/plantar flexion Sensory: Pinprick and light touch normal in all extremities.  Deep Tendon Reflexes: 2+ and symmetric  Cerebellar: normal finger-to-nose with bilateral upper extremities Gait: normal gait and balance CV: distal pulses palpable throughout   Skin: Skin is warm and dry. No rash noted. She is not diaphoretic.  Psychiatric: She has a normal mood and affect. Her behavior is normal. Judgment and thought content normal.  Nursing note and vitals reviewed.   ED Course  Procedures (including critical care time) Labs Review Labs Reviewed - No data to display  Imaging Review Ct Head Wo Contrast  01/01/2015   CLINICAL DATA:  Severe headache.  History of migraines.  EXAM: CT HEAD WITHOUT CONTRAST  TECHNIQUE: Contiguous axial images were obtained from the base of the skull through the vertex without intravenous contrast.  COMPARISON:   Brain CT 03/21/2004.  FINDINGS: Ventricles and sulci are appropriate for patient age. No evidence for acute cortically based infarct, intracranial hemorrhage, mass lesion or mass-effect. Orbits are unremarkable. Paranasal sinuses are unremarkable. Calvarium is intact.  IMPRESSION: No acute intracranial process.   Electronically Signed   By: Annia Belt M.D.   On: 01/01/2015 20:20     EKG Interpretation None      MDM   Final diagnoses:  Headache   MANDEE GINN presents with headache persistent for 6 weeks, worsening in the last 2 weeks and worsening again today.  No thunderclap headache, no associated syncope.  Presentation is like pts typical HA and non concerning for [facility], ICH, Meningitis, or temporal arteritis. Pt is afebrile with no focal neuro deficits, nuchal rigidity, or change in vision. Will give migraine cocktail. Patient request MRI. I informed  her that this should be performed on the outpatient basis as scheduled by her neurologist. Will obtain CT tonight.   10:30PM CT head without acute abnormality. Patient's headache treated and resolved. The emergency department.  She reports she will attend her scheduled MRI for next week and follow-up with her neurologist as directed. She remains neurologically intact here in the emergency department.  I have personally reviewed patient's vitals, nursing note and any pertinent labs or imaging.  I performed an undressed physical exam.    It has been determined that no acute conditions requiring further emergency intervention are present at this time. The patient/guardian have been advised of the diagnosis and plan. I reviewed all labs and imaging including any potential incidental findings. We have discussed signs and symptoms that warrant return to the ED and they are listed in the discharge instructions.    Vital signs are stable at discharge. Last charted vitals show patient tachycardic and hypertensive however on my exam prior to her  discharge she was no longer tachycardic and blood pressure was lower than recorded below.  BP 164/103 mmHg  Pulse 112  Temp(Src) 98.1 F (36.7 C) (Oral)  Resp 18  Ht 5\' 2"  (1.575 m)  Wt 125 lb (56.7 kg)  BMI 22.86 kg/m2  SpO2 100%  LMP 01/17/2013        01/19/2013 Keelen Quevedo, PA-C 01/01/15 2350  03/02/15. Juliet Rude, MD 01/02/15 1525

## 2015-01-01 NOTE — ED Notes (Signed)
Pt reports having headache for extended amount of time. More severe x 2 weeks. Hx of migraines, denies any n/v but is having sensitivity to light and sound. Went to pcp and referred to a neurologist who is going to send her for MRI and told to her take tylenol which does not help her pain.

## 2015-01-01 NOTE — Discharge Instructions (Signed)
1. Medications: toradol, benadryl, compazine, usual home medications 2. Treatment: rest, drink plenty of fluids, take your gabapentin tonight as prescribed 3. Follow Up: Please followup with your neurologist as directed days for discussion of your diagnoses and further evaluation after today's visit; if you do not have a primary care doctor use the resource guide provided to find one; Please return to the ER for intractable headache, weakness, numbness   General Headache Without Cause A headache is pain or discomfort felt around the head or neck area. The specific cause of a headache may not be found. There are many causes and types of headaches. A few common ones are:  Tension headaches.  Migraine headaches.  Cluster headaches.  Chronic daily headaches. HOME CARE INSTRUCTIONS   Keep all follow-up appointments with your caregiver or any specialist referral.  Only take over-the-counter or prescription medicines for pain or discomfort as directed by your caregiver.  Lie down in a dark, quiet room when you have a headache.  Keep a headache journal to find out what may trigger your migraine headaches. For example, write down:  What you eat and drink.  How much sleep you get.  Any change to your diet or medicines.  Try massage or other relaxation techniques.  Put ice packs or heat on the head and neck. Use these 3 to 4 times per day for 15 to 20 minutes each time, or as needed.  Limit stress.  Sit up straight, and do not tense your muscles.  Quit smoking if you smoke.  Limit alcohol use.  Decrease the amount of caffeine you drink, or stop drinking caffeine.  Eat and sleep on a regular schedule.  Get 7 to 9 hours of sleep, or as recommended by your caregiver.  Keep lights dim if bright lights bother you and make your headaches worse. SEEK MEDICAL CARE IF:   You have problems with the medicines you were prescribed.  Your medicines are not working.  You have a change  from the usual headache.  You have nausea or vomiting. SEEK IMMEDIATE MEDICAL CARE IF:   Your headache becomes severe.  You have a fever.  You have a stiff neck.  You have loss of vision.  You have muscular weakness or loss of muscle control.  You start losing your balance or have trouble walking.  You feel faint or pass out.  You have severe symptoms that are different from your first symptoms. MAKE SURE YOU:   Understand these instructions.  Will watch your condition.  Will get help right away if you are not doing well or get worse. Document Released: 12/10/2005 Document Revised: 03/03/2012 Document Reviewed: 12/26/2011 [facility] Patient Information 2015 [place], [place]. This information is not intended to replace advice given to you by your health care provider. Make sure you discuss any questions you have with your health care provider.

## 2015-01-02 ENCOUNTER — Encounter (HOSPITAL_COMMUNITY): Payer: Self-pay | Admitting: Adult Health

## 2015-01-02 ENCOUNTER — Emergency Department (HOSPITAL_COMMUNITY)
Admission: EM | Admit: 2015-01-02 | Discharge: 2015-01-02 | Disposition: A | Discharge status: Home / Self Care | Payer: BLUE CROSS/BLUE SHIELD | Attending: Emergency Medicine | Admitting: Emergency Medicine

## 2015-01-02 DIAGNOSIS — M069 Rheumatoid arthritis, unspecified: Secondary | ICD-10-CM | POA: Insufficient documentation

## 2015-01-02 DIAGNOSIS — Y9389 Activity, other specified: Secondary | ICD-10-CM | POA: Diagnosis not present

## 2015-01-02 DIAGNOSIS — T50995A Adverse effect of other drugs, medicaments and biological substances, initial encounter: Secondary | ICD-10-CM | POA: Diagnosis not present

## 2015-01-02 DIAGNOSIS — Z7982 Long term (current) use of aspirin: Secondary | ICD-10-CM | POA: Insufficient documentation

## 2015-01-02 DIAGNOSIS — Z8719 Personal history of other diseases of the digestive system: Secondary | ICD-10-CM | POA: Insufficient documentation

## 2015-01-02 DIAGNOSIS — Z79899 Other long term (current) drug therapy: Secondary | ICD-10-CM | POA: Insufficient documentation

## 2015-01-02 DIAGNOSIS — Z7951 Long term (current) use of inhaled steroids: Secondary | ICD-10-CM | POA: Insufficient documentation

## 2015-01-02 DIAGNOSIS — F411 Generalized anxiety disorder: Secondary | ICD-10-CM | POA: Insufficient documentation

## 2015-01-02 DIAGNOSIS — Y9289 Other specified places as the place of occurrence of the external cause: Secondary | ICD-10-CM | POA: Insufficient documentation

## 2015-01-02 DIAGNOSIS — I10 Essential (primary) hypertension: Secondary | ICD-10-CM | POA: Insufficient documentation

## 2015-01-02 DIAGNOSIS — Z8669 Personal history of other diseases of the nervous system and sense organs: Secondary | ICD-10-CM | POA: Insufficient documentation

## 2015-01-02 DIAGNOSIS — X58XXXA Exposure to other specified factors, initial encounter: Secondary | ICD-10-CM | POA: Insufficient documentation

## 2015-01-02 DIAGNOSIS — R21 Rash and other nonspecific skin eruption: Secondary | ICD-10-CM | POA: Insufficient documentation

## 2015-01-02 DIAGNOSIS — Z87891 Personal history of nicotine dependence: Secondary | ICD-10-CM | POA: Insufficient documentation

## 2015-01-02 DIAGNOSIS — Y998 Other external cause status: Secondary | ICD-10-CM | POA: Insufficient documentation

## 2015-01-02 DIAGNOSIS — T7840XA Allergy, unspecified, initial encounter: Secondary | ICD-10-CM

## 2015-01-02 DIAGNOSIS — Z862 Personal history of diseases of the blood and blood-forming organs and certain disorders involving the immune mechanism: Secondary | ICD-10-CM | POA: Diagnosis not present

## 2015-01-02 MED ORDER — SODIUM CHLORIDE 0.9 % IV SOLN
Freq: Once | INTRAVENOUS | Status: AC
  Administered 2015-01-02: 20:00:00 via INTRAVENOUS

## 2015-01-02 MED ORDER — PREDNISONE 20 MG PO TABS: ORAL_TABLET | ORAL | Status: DC

## 2015-01-02 MED ORDER — DIPHENHYDRAMINE HCL 50 MG/ML IJ SOLN
50.0000 mg | Freq: Once | INTRAMUSCULAR | Status: AC
  Administered 2015-01-02: 50 mg via INTRAVENOUS
  Filled 2015-01-02: qty 1

## 2015-01-02 MED ORDER — FAMOTIDINE IN NACL 20-0.9 MG/50ML-% IV SOLN
20.0000 mg | Freq: Once | INTRAVENOUS | Status: AC
  Administered 2015-01-02: 20 mg via INTRAVENOUS
  Filled 2015-01-02: qty 50

## 2015-01-02 MED ORDER — METHYLPREDNISOLONE SODIUM SUCC 125 MG IJ SOLR
125.0000 mg | Freq: Once | INTRAMUSCULAR | Status: AC
  Administered 2015-01-02: 125 mg via INTRAVENOUS
  Filled 2015-01-02: qty 2

## 2015-01-02 NOTE — ED Provider Notes (Signed)
CSN: [id number]     Arrival date & time 01/02/15  1923 History   First MD Initiated Contact with Patient 01/02/15 1936     Chief Complaint  Patient presents with  . Allergic Reaction     (Consider location/radiation/quality/duration/timing/severity/associated sxs/prior Treatment) HPI Comments: Patient presents to the ER for evaluation of possible allergic reaction. Patient reports that she has had tongue and throat swelling, slight difficulty with swallowing and slight shortness of breath today. She reports that she was seen in ER yesterday and received medications for her headache. When she went to the pharmacy today, however, to pick up her prescriptions they told her that she cannot take one of them because of her sulfa allergy. She is concerned she might be having a reaction to one of the medications she was given last night.  Patient is a 44 y.o. female presenting with allergic reaction.  Allergic Reaction Presenting symptoms: difficulty swallowing     Past Medical History  Diagnosis Date  . Hyperthyroidism     Had hyperthyroidism in first pregnancy, subsequently treated with radioiodine  . Hypothyroidism     s/p radioiodine treatment for hyperthyroidism  . Rheumatoid arthritis(714.0)   . Hypertension     since age 45- usually ok when pregnant  . Anemia     with 1st pregnancy  . GERD (gastroesophageal reflux disease) 2003    years ago - no meds now  . PP care - s/p C/S 5/7 - twins 04/29/2012  . Acute blood loss anemia 04/30/2012  . Hypothyroidism complicating pregnancy 04/30/2012  . 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 Throat pain   . GAD (generalized anxiety disorder)    Past Surgical History  Procedure Laterality Date  . Cesarean section    . Cesarean section  04/29/2012    Procedure: CESAREAN SECTION;  Surgeon: Lenoard Aden, MD;  Location: [facility];  Service: Gynecology;  Laterality: N/A;  Repeat Cesarean  Section; Twins   Family History  Problem Relation Age of Onset  . Asthma Brother   . Heart disease      both sets of grandparents  . Rheum arthritis Sister   . Lung cancer Paternal Grandfather   . Goiter Mother   . Migraines Mother   . Hyperlipidemia Mother   . Hypertension Father     paternal grandmother  . Diabetes Paternal Grandmother   . Arthritis Maternal Grandmother    History  Substance Use Topics  . Smoking status: Former Smoker -- 0.25 packs/day for 12 years    Types: Cigarettes    Quit date: 12/25/2007  . Smokeless tobacco: Never Used  . Alcohol Use: 0.0 oz/week    0 Not specified per week     Comment: 3 glasses of wine weekly   OB History    Gravida Para Term Preterm AB TAB SAB Ectopic Multiple Living   2 2 2      1 3      Review of Systems  HENT: Positive for trouble swallowing. Negative for drooling.   Respiratory: Positive for shortness of breath.   All other systems reviewed and are negative.     Allergies  Propylthiouracil; Wellbutrin; and Sulfa antibiotics  Home Medications   Prior to Admission medications   Medication Sig Start Date End Date Taking? Authorizing Provider  acetaminophen (TYLENOL) 650 MG CR tablet Take 1,300 mg by mouth every 8 (eight) hours as needed for pain.    Yes  Historical Provider, MD  aspirin-acetaminophen-caffeine (EXCEDRIN MIGRAINE) [phone] MG per tablet Take 2 tablets by mouth every 8 (eight) hours as needed for headache.   Yes Historical Provider, MD  clonazePAM (KLONOPIN) 1 MG tablet Take 1 mg by mouth at bedtime as needed for anxiety.    Yes Historical Provider, MD  cyclobenzaprine (FLEXERIL) 10 MG tablet Take 5 mg by mouth 3 (three) times daily as needed for muscle spasms.  12/27/14  Yes Historical Provider, MD  diphenhydrAMINE (BENADRYL) 25 MG tablet Take 1 tablet (25 mg total) by mouth every 6 (six) hours. 01/01/15  Yes Hannah Muthersbaugh, PA-C  doxycycline (VIBRA-TABS) 100 MG tablet Take 100 mg by mouth 2 (two) times  daily. Started 12/25/13, for 10 days ending 01/03/14 12/26/14  Yes Historical Provider, MD  fluticasone (FLONASE) 50 MCG/ACT nasal spray Place 2 sprays into both nostrils daily.   Yes Historical Provider, MD  gabapentin (NEURONTIN) 100 MG capsule Take 1 pill nightly at bedtime for 1 week, then 2 pills nightly for 1 week, then 3 pills each night thereafter. 12/31/14  Yes Huston Foley, MD  HUMIRA PEN 40 MG/0.8ML injection Inject 40 mg into the skin every 14 (fourteen) days. Every 2 weeks 09/29/13  Yes Historical Provider, MD  ibuprofen (ADVIL,MOTRIN) 200 MG tablet Take 400 mg by mouth every 6 (six) hours as needed for headache.    Yes Historical Provider, MD  ketorolac (TORADOL) 10 MG tablet Take 1 tablet (10 mg total) by mouth every 6 (six) hours as needed. 01/01/15  Yes Hannah Muthersbaugh, PA-C  levothyroxine (SYNTHROID, LEVOTHROID) 112 MCG tablet Take 112 mcg by mouth daily before breakfast.   Yes Historical Provider, MD  polyvinyl alcohol (LIQUIFILM TEARS) 1.4 % ophthalmic solution Place 1 drop into both eyes every morning.   Yes Historical Provider, MD  prochlorperazine (COMPAZINE) 10 MG tablet Take 1 tablet (10 mg total) by mouth every 8 (eight) hours as needed for nausea or vomiting (Nausea). 01/01/15  Yes Hannah Muthersbaugh, PA-C  sertraline (ZOLOFT) 50 MG tablet Take 50 mg by mouth daily.   Yes Historical Provider, MD  predniSONE (DELTASONE) 20 MG tablet 3 tabs po daily x 3 days, then 2 tabs x 3 days, then 1.5 tabs x 3 days, then 1 tab x 3 days, then 0.5 tabs x 3 days 01/02/15   Gilda Crease, MD   BP 151/110 mmHg  Pulse 89  Temp(Src) 97.3 F (36.3 C) (Oral)  Resp 18  Ht 5\' 2"  (1.575 m)  Wt 125 lb (56.7 kg)  BMI 22.86 kg/m2  SpO2 100%  LMP 01/17/2013 Physical Exam  Constitutional: She is oriented to person, place, and time. She appears well-developed and well-nourished. No distress.  HENT:  Head: Normocephalic and atraumatic.  Right Ear: Hearing normal.  Left Ear: Hearing normal.   Nose: Nose normal.  Mouth/Throat: Oropharynx is clear and moist and mucous membranes are normal.  Eyes: Conjunctivae and EOM are normal. Pupils are equal, round, and reactive to light.  Neck: Normal range of motion. Neck supple.  Cardiovascular: Regular rhythm, S1 normal and S2 normal.  Exam reveals no gallop and no friction rub.   No murmur heard. Pulmonary/Chest: Effort normal and breath sounds normal. No respiratory distress. She exhibits no tenderness.  Abdominal: Soft. Normal appearance and bowel sounds are normal. There is no hepatosplenomegaly. There is no tenderness. There is no rebound, no guarding, no tenderness at McBurney's point and negative Murphy's sign. No hernia.  Musculoskeletal: Normal range of motion.  Neurological: She is  alert and oriented to person, place, and time. She has normal strength. No cranial nerve deficit or sensory deficit. Coordination normal. GCS eye subscore is 4. GCS verbal subscore is 5. GCS motor subscore is 6.  Skin: Skin is warm, dry and intact. Rash (faint erythematous papular rash on extremities) noted. No cyanosis.  Psychiatric: She has a normal mood and affect. Her speech is normal and behavior is normal. Thought content normal.  Nursing note and vitals reviewed.   ED Course  Procedures (including critical care time) Labs Review Labs Reviewed - No data to display  Imaging Review    EKG Interpretation None      MDM   Final diagnoses:  Allergic reaction, initial encounter   Patient presents to the ER for evaluation of faint rash on her extremities, tongue and throat swelling with mild shortness of breath. Patient was concerned about the possibility of an allergic reaction to medication she was given for migraine headache yesterday. This may be possible, but I am also concerned about the possibility of reaction to doxycycline which he is currently taking for sinus infection. Patient was administered Benadryl, Pepcid, Solu-Medrol and  monitored. Her symptoms have resolved and she appears well. She will be discharged, continue prednisone taper. She has been counseled to stop all of the new medications and follow-up with her doctor. Return if her symptoms worsen.    Gilda Crease, MD 01/02/15 2236

## 2015-01-02 NOTE — Discharge Instructions (Signed)
Drug Allergy °Allergic reactions to medicines are common. Some allergic reactions are mild. A delayed type of drug allergy that occurs 1 week or more after exposure to a medicine or vaccine is called serum sickness. A life-threatening, sudden (acute) allergic reaction that involves the whole body is called anaphylaxis. °CAUSES  °"True" drug allergies occur when there is an allergic reaction to a medicine. This is caused by overactivity of the immune system. First, the body becomes sensitized. The immune system is triggered by your first exposure to the medicine. Following this first exposure, future exposure to the same medicine may be life-threatening. °Almost any medicine can cause an allergic reaction. Common ones are: °· Penicillin. °· Sulfonamides (sulfa drugs). °· Local anesthetics. °· X-ray dyes that contain iodine. °SYMPTOMS  °Common symptoms of a minor allergic reaction are: °· Swelling around the mouth. °· An itchy red rash or hives. °· Vomiting or diarrhea. °Anaphylaxis can cause swelling of the mouth and throat. This makes it difficult to breathe and swallow. Severe reactions can be fatal within seconds, even after exposure to only a trace amount of the drug that causes the reaction. °HOME CARE INSTRUCTIONS  °· If you are unsure of what caused your reaction, keep a diary of foods and medicines used. Include the symptoms that followed. Avoid anything that causes reactions. °· You may want to follow up with an allergy specialist after the reaction has cleared in order to be tested to confirm the allergy. It is important to confirm that your reaction is an allergy, not just a side effect to the medicine. If you have a true allergy to a medicine, this may prevent that medicine and related medicines from being given to you when you are very ill. °· If you have hives or a rash: °¨ Take medicines as directed by your caregiver. °¨ You may use an over-the-counter antihistamine (diphenhydramine) as  needed. °¨ Apply cold compresses to the skin or take baths in cool water. Avoid hot baths or showers. °· If you are severely allergic: °¨ Continuous observation after a severe reaction may be needed. Hospitalization is often required. °¨ Wear a medical alert bracelet or necklace stating your allergy. °¨ You and your family must learn how to use an anaphylaxis kit or give an epinephrine injection to temporarily treat an emergency allergic reaction. If you have had a severe reaction, always carry your epinephrine injection or anaphylaxis kit with you. This can be lifesaving if you have a severe reaction. °· Do not drive or perform tasks after treatment until the medicines used to treat your reaction have worn off, or until your caregiver says it is okay. °SEEK MEDICAL CARE IF:  °· You think you had an allergic reaction. Symptoms usually start within 30 minutes after exposure. °· Symptoms are getting worse rather than better. °· You develop new symptoms. °· The symptoms that brought you to your caregiver return. °SEEK IMMEDIATE MEDICAL CARE IF:  °· You have swelling of the mouth, difficulty breathing, or wheezing. °· You have a tight feeling in your chest or throat. °· You develop hives, swelling, or itching all over your body. °· You develop severe vomiting or diarrhea. °· You feel faint or pass out. °This is an emergency. Use your epinephrine injection or anaphylaxis kit as you have been instructed. Call for emergency medical help. Even if you improve after the injection, you need to be examined at a hospital emergency department. °MAKE SURE YOU:  °· Understand these instructions. °· Will watch   your condition.  Will get help right away if you are not doing well or get worse. Document Released: 12/10/2005 Document Revised: 03/03/2012 Document Reviewed: 05/16/2011 [facility] Patient Information 2015 [place], [place]. This information is not intended to replace advice given to you by your health care provider. Make  sure you discuss any questions you have with your health care provider.

## 2015-01-02 NOTE — ED Notes (Signed)
Presents with allergic reaction to sulfa began yesterday, reports that her tongue swelling has not gone down and she has difficulty swallowing now. She is handling secretions and sats are 100%. No stridor.

## 2015-01-02 NOTE — ED Notes (Signed)
Pt ambulated to the bathroom.  

## 2015-01-03 NOTE — Progress Notes (Signed)
ED CM received call from patient stating that she was seen in [facility] ED yesterday for and treated for an allergic reaction. Patient stil c/o swollen tongue and difficulty swallowing. Patient advised to come back to ED to be evaluated. Patient states, she will come back asap.

## 2015-01-04 ENCOUNTER — Telehealth: Payer: Self-pay | Admitting: Neurology

## 2015-01-04 ENCOUNTER — Other Ambulatory Visit: Payer: 59

## 2015-01-04 NOTE — Telephone Encounter (Signed)
Patient went to ER on Saturday for Migraine Cocktail.  Sunday am woke up with swollen tongue, difficulty eating and swallowing, went back to ER they gave IV cocktail to reverse the effects of migraine cocktail.  Instructed her to stop gabapentin (NEURONTIN) 100 MG capsule and other medication for Migraines.  Was told to take Prednisone and Benadryl only.  Questioning what should she do, very upset and concerned.  Please call and advise.

## 2015-01-04 NOTE — Telephone Encounter (Signed)
I just spoke to Dr. Barbaraann Barthel about this patient. She went to the emergency room on 01/01/2015 with severe headache. She received migraine cocktail and woke up the next day with difficulty swallowing, feeling of swelling of her tongue and went back to the emergency room on 01/02/2015 at which time she was given prednisone and Benadryl. She was seen in her primary care physician's office today for follow-up. Her speech and swallowing have improved. Her tongue feels better. She has had no further headache in the past 2 days which could've been secondary to the migraine cocktail. She is on a prednisone taper. We mutually agreed to keep her off the gabapentin for now. Her head CT on 01/01/2015 was unremarkable. We will go ahead and schedule her for her brain MRI with and without contrast and we will be in touch with patient regarding the results. Dr. Barbaraann Barthel did not notice any focal neurological deficits which is reassuring.  I will look into where we are with the scheduling process of her brain MRI. Dr. Barbaraann Barthel was in agreement.   Ammie: Please call patient back: Please explained to her that Dr. Barbaraann Barthel and I talked. We mutually agreed to keep her off of gabapentin for now. I do not think she had a reaction to gabapentin which as it is not typical but not impossible. Nevertheless, it is probably best to stick with the prednisone taper that was given to her from the emergency room. We will go ahead and get her scheduled for her MRI brain.  Bradly Chris: can you look into MRI brain to be scheduled?

## 2015-01-04 NOTE — Telephone Encounter (Signed)
According to her primary care physician her speech has improved. As long as her symptoms continue to improve this is reassuring. Overall, it is hard to say what really happened and what really caused the allergic reaction and in fact if she truly had an allergic reaction.

## 2015-01-04 NOTE — Telephone Encounter (Signed)
Shared message with patient, verbalized understanding but wanted to know if speech is permanent or temporary?

## 2015-01-05 ENCOUNTER — Telehealth: Payer: Self-pay | Admitting: Neurology

## 2015-01-05 NOTE — Telephone Encounter (Signed)
Patient called back and wants to know if she can come in for a sooner appointment, is scared and wants to get some peace of mind. She is also anxious and was wondering if she can taper off the prednisone more aggressive. Spoke with Dr Frances Furbish and patient has been scheduled for sooner appt.

## 2015-01-05 NOTE — Telephone Encounter (Signed)
Called and left message for patient to return call for response from Dr Frances Furbish concerning her question

## 2015-01-05 NOTE — Telephone Encounter (Signed)
Shared message with patient and verbalized  Understanding but wanted to know if she should come in for a sooner appt. (scheduled for April), since her Pcp said that she should f/u with her Neurologist since her speech was  affected. Should this be done before or after MRI?

## 2015-01-05 NOTE — Telephone Encounter (Signed)
Patient has been scheduled for sooner appt.per her request.

## 2015-01-05 NOTE — Telephone Encounter (Signed)
Pt is calling back needing to know if she needs to make an appointment to come in and see Dr. Frances Furbish first or have the MRI first.  Please call and advise.

## 2015-01-05 NOTE — Telephone Encounter (Signed)
Let's do the MRI and we will check in with her after that is available. We can probably get the MRI done in the next couple of days.

## 2015-01-06 ENCOUNTER — Encounter: Payer: Self-pay | Admitting: Neurology

## 2015-01-06 ENCOUNTER — Ambulatory Visit (INDEPENDENT_AMBULATORY_CARE_PROVIDER_SITE_OTHER): Payer: BLUE CROSS/BLUE SHIELD | Admitting: Neurology

## 2015-01-06 VITALS — BP 153/98 | HR 110 | Temp 99.1°F | Ht 62.0 in | Wt 126.0 lb

## 2015-01-06 DIAGNOSIS — Z79899 Other long term (current) drug therapy: Secondary | ICD-10-CM

## 2015-01-06 DIAGNOSIS — R51 Headache: Secondary | ICD-10-CM

## 2015-01-06 DIAGNOSIS — M069 Rheumatoid arthritis, unspecified: Secondary | ICD-10-CM

## 2015-01-06 DIAGNOSIS — Z79620 Long term (current) use of immunosuppressive biologic: Secondary | ICD-10-CM

## 2015-01-06 DIAGNOSIS — T50905D Adverse effect of unspecified drugs, medicaments and biological substances, subsequent encounter: Secondary | ICD-10-CM

## 2015-01-06 DIAGNOSIS — G44209 Tension-type headache, unspecified, not intractable: Secondary | ICD-10-CM

## 2015-01-06 DIAGNOSIS — R519 Headache, unspecified: Secondary | ICD-10-CM

## 2015-01-06 NOTE — Progress Notes (Signed)
Subjective:    Patient ID: Tracey Nichols is a 44 y.o. female.  HPI     Interim history:  Tracey Nichols is a 44 year old right-handed woman with an underlying medical history of thyroid disease with radioiodine treatment of hyperthyroidism (developed during pregnance), then treatment for hypothyroidism (under the care of Dr. Chalmers Cater), hypertension, rheumatoid arthritis (under the care of Dr. Estanislado Pandy), isolated seizure event (while on Wellbutrin), and reflux disease, who reports for a sooner than scheduled appointment for recurrent headaches. She is accompanied by her sister-in-law today. I first met her on 12/31/2014 at the request of her primary care physician. At which time the patient gave a long-standing history of migraine headaches, diagnosed when she was in her 80s. She was also diagnosed with rheumatoid arthritis in her late 22s. For this she has been on methotrexate for years, then Plaquenil, and has been on Humira for the past 5 years. At first visit, she had a nonfocal exam. She reported a 6 week history of daily headaches. I felt she had a combination of migraines without aura and tension-type headaches. She was quite anxious at the time. I suggested low-dose gabapentin for prevention with slow titration. She was also advised to have a brain MRI with and without contrast.  In the interim, she presented to the emergency room with more severe headache. She was treated with a headache cocktail and a head CT was negative. Her headache improved in the emergency room. She went back to the emergency room on 01/02/2015 for possible allergic reaction. At that time she had taken 2 days of gabapentin low-dose. She reported tongue and throat swelling, slight difficulty with swallowing and slight shortness of breath today. Her exam was benign. She was treated with Benadryl, Pepcid and Solu-Medrol and was given a prednisone taper.  She called the emergency room back on 01/03/2015 with ongoing symptoms of  tongue swelling and difficulty swallowing and was advised to come back to the emergency room. I did not see that she actually went back. She was seen by her primary care physician on 01/04/2015 and appeared improved.   On 01/04/2015, I spoke to Dr. Radene Ou about the patient. Her speech and swallowing had improved. Her tongue felt better. She had no further headache for the past 2 days which could have been secondary to the migraine cocktail and the prednisone taper. We mutually agreed to keep her off the gabapentin. Her head CT on 01/01/2015 was unremarkable. Dr. Radene Ou did not notice any focal neurological deficits. She is scheduled for a brain MRI next week. The patient called on 01/05/2015 requesting a sooner appointment.   Today, she reports that her speech is not back to normal, her tongue motility on the R is not normal, she has had no headaches since the ER visit and she is on the prednisone taper. She started off with 3 days of 60 mg, today is the first day of 3 on 40 mg daily and she will taper in 3 day intervals down to none. She has not been taking any Benadryl, no more gabapentin, no Toradol, had not tried the Compazine, and even though the pharmacist told her that the Toradol has cross-reaction with sulfa allergies, she had previously taken Toradol successfully. She has no other focal neurologic symptoms. She is more anxious. She feels that she has not received a full answer as to what exactly happened. In the emergency room she was told she had a medication reaction to doxycycline, her primary care physician told her  that she did not feel it was an allergic reaction perhaps a neurological reaction to gabapentin. She has stopped all caffeine and is trying to stay well-hydrated with water only.  She has a long-standing history of migraine headaches. She was diagnosed with migraines when she was in her early 57s. She was diagnosed with rheumatoid arthritis in her late 31s. She has been on  methotrexate and Plaquenil for her rheumatoid arthritis has been on Humira for the past 5 years or so. Her headache is typically throbbing and she has occasional visual aura. She has occasional nausea and rare vomiting. She has typically photophobia and sonophobia. Lately since Thanksgiving she has had a different type of headache which seems to be posterior in her neck and radiates upwards like an horseshoe-like ring in the back of her head. She has had a daily headache for the past 6 weeks. She was diagnosed with anxiety a long time ago but was reluctant to take medication. She was recently started on Zoloft with low dose and slow titration to up to 100 mg which then caused her mood related side effects. She feels that she has had a chronic headache since she was started on Zoloft. It was scaled back to 50 mg at this time. She was recently given a prescription for Flexeril which she has taken infrequently. Caffeine or Excedrin Migraine helps her headaches. She has reduced her caffeine intake orally. But lately has been drinking more caffeine. She tries to drink enough water. She endorses stress. She recently got laid off. She has a 64-year-old and a set of 58-year-old twins. She denies any one-sided weakness or numbness. In the past she used to go to the [facility] and was treated with imipramine. She was then given Wellbutrin some years ago for smoking cessation and had the isolated seizure event. She was stopped on Wellbutrin and imipramine at the time. She has taken Maxalt for headache abortive treatment. In the last few weeks she has noticed an increase in her blood pressure. She was treated for high blood pressure for quite some time until she got pregnant. She was diagnosed with hypertension in her 29s. Her sister has been diagnosed with rheumatoid arthritis as well. She has a migraine history in her mother and possibly maternal grandmother. Of note, she snores some but has been able to lose  about 10 pounds since she changed her dietary habits. She has woken herself up with a gasping sensation one time. Her has noted that she snores. Her husband notices her snoring but he is a heavy sleeper.   She has recently seen an ophthalmologist. She started seeing an ophthalmologist when she was on plaque paternal and her recent exam was normal. She is a little farsighted.  Her Past Medical History Is Significant For: Past Medical History  Diagnosis Date  . Hyperthyroidism     Had hyperthyroidism in first pregnancy, subsequently treated with radioiodine  . Hypothyroidism     s/p radioiodine treatment for hyperthyroidism  . Rheumatoid arthritis(714.0)   . Hypertension     since age 58- usually ok when pregnant  . Anemia     with 1st pregnancy  . GERD (gastroesophageal reflux disease) 2003    years ago - no meds now  . PP care - s/p C/S 5/7 - twins 04/29/2012  . Acute blood loss anemia 04/30/2012  . Hypothyroidism complicating pregnancy 0/05/44  . 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  Throat pain   . GAD (generalized anxiety disorder)     Her Past Surgical History Is Significant For: Past Surgical History  Procedure Laterality Date  . Cesarean section    . Cesarean section  04/29/2012    Procedure: CESAREAN SECTION;  Surgeon: Lovenia Kim, MD;  Location: [facility];  Service: Gynecology;  Laterality: N/A;  Repeat Cesarean Section; Twins    Her Family History Is Significant For: Family History  Problem Relation Age of Onset  . Asthma Brother   . Heart disease      both sets of grandparents  . Rheum arthritis Sister   . Lung cancer Paternal Grandfather   . Goiter Mother   . Migraines Mother   . Hyperlipidemia Mother   . Hypertension Father     paternal grandmother  . Diabetes Paternal Grandmother   . Arthritis Maternal Grandmother     Her Social History Is Significant For: History   Social History  . Marital  Status: Married    Spouse Name: N/A    Number of Children: 3  . Years of Education: N/A   Social History Main Topics  . Smoking status: Former Smoker -- 0.25 packs/day for 12 years    Types: Cigarettes    Quit date: 12/25/2007  . Smokeless tobacco: Never Used  . Alcohol Use: 0.0 oz/week    0 Not specified per week     Comment: 3 glasses of wine weekly  . Drug Use: No  . Sexual Activity: Yes    Birth Control/ Protection: None   Other Topics Concern  . None   Social History Narrative    Her Allergies Are:  Allergies  Allergen Reactions  . Propylthiouracil Other (See Comments)    Lowered white blood cell count  . Wellbutrin [Bupropion Hcl] Other (See Comments)    seizure  . Sulfa Antibiotics Rash  :   Her Current Medications Are:  Outpatient Encounter Prescriptions as of 01/06/2015  Medication Sig  . clonazePAM (KLONOPIN) 1 MG tablet Take 1 mg by mouth at bedtime as needed for anxiety.   Marland Kitchen levothyroxine (SYNTHROID, LEVOTHROID) 112 MCG tablet Take 112 mcg by mouth daily before breakfast.  . polyvinyl alcohol (LIQUIFILM TEARS) 1.4 % ophthalmic solution Place 1 drop into both eyes every morning.  . predniSONE (DELTASONE) 20 MG tablet 3 tabs po daily x 3 days, then 2 tabs x 3 days, then 1.5 tabs x 3 days, then 1 tab x 3 days, then 0.5 tabs x 3 days  . sertraline (ZOLOFT) 50 MG tablet Take 50 mg by mouth daily.  . [DISCONTINUED] acetaminophen (TYLENOL) 650 MG CR tablet Take 1,300 mg by mouth every 8 (eight) hours as needed for pain.   . [DISCONTINUED] aspirin-acetaminophen-caffeine (EXCEDRIN MIGRAINE) 250-250-65 MG per tablet Take 2 tablets by mouth every 8 (eight) hours as needed for headache.  . [DISCONTINUED] cyclobenzaprine (FLEXERIL) 10 MG tablet Take 5 mg by mouth 3 (three) times daily as needed for muscle spasms.   . [DISCONTINUED] diphenhydrAMINE (BENADRYL) 25 MG tablet Take 1 tablet (25 mg total) by mouth every 6 (six) hours. (Patient not taking: Reported on 01/06/2015)  .  [DISCONTINUED] doxycycline (VIBRA-TABS) 100 MG tablet Take 100 mg by mouth 2 (two) times daily. Started 12/25/13, for 10 days ending 01/03/14  . [DISCONTINUED] fluticasone (FLONASE) 50 MCG/ACT nasal spray Place 2 sprays into both nostrils daily.  . [DISCONTINUED] gabapentin (NEURONTIN) 100 MG capsule Take 1 pill nightly at bedtime for 1 week, then 2 pills nightly  for 1 week, then 3 pills each night thereafter. (Patient not taking: Reported on 01/06/2015)  . [DISCONTINUED] HUMIRA PEN 40 MG/0.8ML injection Inject 40 mg into the skin every 14 (fourteen) days. Every 2 weeks  . [DISCONTINUED] ibuprofen (ADVIL,MOTRIN) 200 MG tablet Take 400 mg by mouth every 6 (six) hours as needed for headache.   . [DISCONTINUED] ketorolac (TORADOL) 10 MG tablet Take 1 tablet (10 mg total) by mouth every 6 (six) hours as needed. (Patient not taking: Reported on 01/06/2015)  . [DISCONTINUED] prochlorperazine (COMPAZINE) 10 MG tablet Take 1 tablet (10 mg total) by mouth every 8 (eight) hours as needed for nausea or vomiting (Nausea). (Patient not taking: Reported on 01/06/2015)  :  Review of Systems:  Out of a complete 14 point review of systems, all are reviewed and negative with the exception of these symptoms as listed below:   Review of Systems  Neurological:       Speech,weakness in right arm    Objective:  Neurologic Exam  Physical Exam Physical Examination:   Filed Vitals:   01/06/15 1120  BP: 153/98  Pulse: 110  Temp: 99.1 F (37.3 C)   General Examination: The patient is a very pleasant 44 y.o. female in no acute distress. She appears well-developed and well-nourished and very well groomed. She is slender. She is quite anxious appearing today. She seems to be close to tears at times.   HEENT: Normocephalic, atraumatic, pupils are equal, round and reactive to light and accommodation. Funduscopic exam is normal with sharp disc margins noted. Extraocular tracking is good without limitation to gaze excursion  or nystagmus noted. Normal smooth pursuit is noted. Hearing is grossly intact. Tympanic membranes are clear bilaterally. Face is symmetric with normal facial animation and normal facial sensation. Speech is  not dysarthric but not 100% clear either. It looks like she is keeping her tongue touched against her bottom teeth while talking. She has no tongue swelling, no bite lesions on the sides of her tongue, and good tongue motility on both sides. There is no hypophonia. There is no lip, neck/head, jaw or voice tremor. Neck is supple with full range of passive and active motion. There are no carotid bruits on auscultation. Oropharynx exam reveals: mild mouth dryness, good dental hygiene and moderate airway crowding, due to larger tonsils and larger uvula. She has mild pharyngeal erythema, unchanged and no evidence of drainage. Mallampati is class II, palate elevates symmetrically.   Chest: Clear to auscultation without wheezing, rhonchi or crackles noted.  Heart: S1+S2+0, regular and normal without murmurs, rubs or gallops noted.   Abdomen: Soft, non-tender and non-distended with normal bowel sounds appreciated on auscultation.  Extremities: There is no pitting edema in the distal lower extremities bilaterally. Pedal pulses are intact.  Skin: Warm and dry without trophic changes noted. There are no varicose veins.  Musculoskeletal: exam reveals no obvious joint deformities, tenderness or joint swelling or erythema.   Neurologically:  Mental status: The patient is awake, alert and oriented in all 4 spheres. Her immediate and remote memory, attention, language skills and fund of knowledge are appropriate. There is no evidence of aphasia, agnosia, apraxia or anomia. Speech is clear with normal prosody and enunciation. Thought process is linear. Mood is normal and affect is normal.  Cranial nerves II - XII are as described above under HEENT exam. In addition: shoulder shrug is normal with equal shoulder  height noted. Motor exam: Normal bulk, strength and tone is noted. There is no drift,  tremor or rebound. Romberg is negative. Reflexes are 2+ throughout. Fine motor skills and coordination: intact with normal finger taps, normal hand movements, normal rapid alternating patting, normal foot taps and normal foot agility.  Cerebellar testing: No dysmetria or intention tremor on finger to nose testing. Heel to shin is unremarkable bilaterally. There is no truncal or gait ataxia.  Sensory exam: intact to light touch, pinprick, vibration, temperature sense  in the upper and lower extremities.  Gait, station and balance: She stands easily. No veering to one side is noted. No leaning to one side is noted. Posture is age-appropriate and stance is narrow based. Gait shows normal stride length and normal pace. No problems turning are noted. She turns en bloc. Tandem walk is unremarkable.        Assessment and Plan:   In summary, LINZEE DEPAUL is a very pleasant 44 y.o.-year old female with an underlying medical history of thyroid disease with radioiodine treatment of hyperthyroidism, then treatment for hypothyroidism (Dr. Chalmers Cater), hypertension, rheumatoid arthritis (Dr. Estanislado Pandy), isolated seizure event, reflux disease,  whom I met for the first time on 12/31/2014 for a daily headache for about 6 weeks. I felt this was a combination of migraine headaches, and tension headaches. She returns for a sooner than scheduled appointment for a possible medication reaction. She went to the emergency room last week with a migraine exacerbation, received a migraine cocktail and went home. She woke up with a sense of tongue swelling, dysarthria and difficulty swallowing. She went back to the emergency room and was treated for a possible allergic reaction, which the emergency room doctor felt was more likely due to doxycycline which she had taken for sinus infection for total of 8 days. She was told to stop doxycycline, and was  treated for an allergic reaction with prednisone and Benadryl and Pepcid. She was discharged with an oral prednisone taper and Benadryl orally. She has not taken any more Benadryl but is on the steroid taper. She had taken 2 doses of gabapentin 100 mg each night on the eighth and ninth of January but I do not believe this is a delayed or protracted reaction on gabapentin. I am not sure exactly as to the nature or etiology of her presentation. She could've had a medication reaction to the antibiotic or a combination of medicines including the Toradol but at this point this will be difficult to tease out. Nevertheless she is no longer on gabapentin at this time. I advised to stay well-hydrated and complete her steroid taper. Her speech is improved but she does not feel back to normal with her tongue motility. I do not see any focal neurologic signs at this time and reassured her. She is advised that isolated tongue problems are typically not primary neurologic or central nervous system in origin. Nevertheless she is advised to not add any new medications or restart gabapentin at this time. We can revisit this down the road. Thankfully her headaches have resolved and this could be because of the steroids breaking the cycle and she has also been hydrating better and decreased or eliminated her caffeine altogether. She has an appointment with me in April which I asked her to keep. She has an appointment for an MRI brain next week and we will call her after the results are available.

## 2015-01-06 NOTE — Patient Instructions (Signed)
Continue prednisone taper as instructed.  We will do the MRI next week and call you.  I will see you back in April.

## 2015-01-10 ENCOUNTER — Ambulatory Visit
Admission: RE | Admit: 2015-01-10 | Discharge: 2015-01-10 | Disposition: A | Discharge status: Home / Self Care | Payer: BLUE CROSS/BLUE SHIELD | Source: Ambulatory Visit | Attending: Neurology | Admitting: Neurology

## 2015-01-10 DIAGNOSIS — Z79899 Other long term (current) drug therapy: Secondary | ICD-10-CM

## 2015-01-10 DIAGNOSIS — G44209 Tension-type headache, unspecified, not intractable: Secondary | ICD-10-CM

## 2015-01-10 DIAGNOSIS — M069 Rheumatoid arthritis, unspecified: Secondary | ICD-10-CM

## 2015-01-10 DIAGNOSIS — Z79620 Long term (current) use of immunosuppressive biologic: Secondary | ICD-10-CM

## 2015-01-10 DIAGNOSIS — R51 Headache: Secondary | ICD-10-CM

## 2015-01-10 DIAGNOSIS — G43719 Chronic migraine without aura, intractable, without status migrainosus: Secondary | ICD-10-CM

## 2015-01-10 DIAGNOSIS — R519 Headache, unspecified: Secondary | ICD-10-CM

## 2015-01-10 MED ORDER — GADOBENATE DIMEGLUMINE 529 MG/ML IV SOLN
11.0000 mL | Freq: Once | INTRAVENOUS | Status: AC | PRN
  Administered 2015-01-10: 11 mL via INTRAVENOUS

## 2015-01-11 ENCOUNTER — Telehealth: Payer: Self-pay | Admitting: Neurology

## 2015-01-11 ENCOUNTER — Ambulatory Visit
Admission: RE | Admit: 2015-01-11 | Discharge: 2015-01-11 | Disposition: A | Discharge status: Home / Self Care | Payer: BLUE CROSS/BLUE SHIELD | Source: Ambulatory Visit | Attending: Endocrinology | Admitting: Endocrinology

## 2015-01-11 DIAGNOSIS — E049 Nontoxic goiter, unspecified: Secondary | ICD-10-CM

## 2015-01-11 NOTE — Telephone Encounter (Signed)
Patient returning Tracey Nichols call.  Requesting a return call today.

## 2015-01-11 NOTE — Progress Notes (Signed)
Quick Note:  Please call patient regarding the recent brain MRI: The brain scan showed a normal structure of the brain and no evidence of volume loss which we call atrophy. There were changes in the deeper structures of the brain, which we call white matter changes or microvascular changes. These were reported as minimal in Her case. These are tiny white spots, that occur with time and are seen in a variety of conditions, including with normal aging, chronic hypertension, chronic headaches, especially migraine HAs, chronic diabetes, chronic hyperlipidemia. These are not strokes and no mass or lesion or contrast enhancement was seen which is reassuring. Again, there were no acute findings, such as a stroke, or mass or blood products. No further action is required on this test at this time, other than re-enforcing the importance of good blood pressure control, good cholesterol control, good blood sugar control, and weight management. Please remind patient to keep any upcoming appointments or tests and to call us with any interim questions, concerns, problems or updates. Thanks,  Huston Foley, MD, PhD    ______

## 2015-01-12 ENCOUNTER — Other Ambulatory Visit: Payer: BLUE CROSS/BLUE SHIELD

## 2015-01-12 NOTE — Telephone Encounter (Signed)
Results given by Andrey Campanile but patient is still having slurred speech

## 2015-01-12 NOTE — Progress Notes (Signed)
Quick Note:  Pt called back for results. I gave her the MRI brain results. She is still having pain, and is exploring other testing. She continues with her slurred speech. Will forward to Dr. Frances Furbish. ______

## 2015-01-12 NOTE — Telephone Encounter (Signed)
As long as she is gradually improving we will can continue to monitor.

## 2015-01-12 NOTE — Telephone Encounter (Signed)
Shared message with patient and she verbalized understanding and said ok.

## 2015-01-12 NOTE — Progress Notes (Signed)
As long as she is improving we can continue watchful waiting.

## 2015-01-14 ENCOUNTER — Encounter: Payer: Self-pay | Admitting: Neurology

## 2015-01-17 ENCOUNTER — Other Ambulatory Visit: Payer: Self-pay | Admitting: Neurology

## 2015-01-17 DIAGNOSIS — R4781 Slurred speech: Secondary | ICD-10-CM

## 2015-01-17 DIAGNOSIS — R51 Headache: Principal | ICD-10-CM

## 2015-01-17 DIAGNOSIS — R519 Headache, unspecified: Secondary | ICD-10-CM

## 2015-01-31 ENCOUNTER — Telehealth: Payer: Self-pay | Admitting: Neurology

## 2015-01-31 NOTE — Telephone Encounter (Signed)
I received a fax on 01/28/15 regarding the referral I sent on 01/18/15 that I needed to call and schedule the new patient appointment. I called and schedule patient and called patient to advise of appointment and she informed me she had already found a provider to help her with her headaches. I apologized for the turnaround time to patient.

## 2015-04-04 ENCOUNTER — Ambulatory Visit: Payer: BLUE CROSS/BLUE SHIELD | Admitting: Neurology

## 2016-04-18 DIAGNOSIS — Z79899 Other long term (current) drug therapy: Secondary | ICD-10-CM | POA: Diagnosis not present

## 2016-05-15 DIAGNOSIS — M069 Rheumatoid arthritis, unspecified: Secondary | ICD-10-CM | POA: Diagnosis not present

## 2016-05-15 DIAGNOSIS — E559 Vitamin D deficiency, unspecified: Secondary | ICD-10-CM | POA: Diagnosis not present

## 2016-05-15 DIAGNOSIS — E890 Postprocedural hypothyroidism: Secondary | ICD-10-CM | POA: Diagnosis not present

## 2016-05-15 DIAGNOSIS — I10 Essential (primary) hypertension: Secondary | ICD-10-CM | POA: Diagnosis not present

## 2016-05-24 DIAGNOSIS — F411 Generalized anxiety disorder: Secondary | ICD-10-CM | POA: Diagnosis not present

## 2016-06-12 DIAGNOSIS — E890 Postprocedural hypothyroidism: Secondary | ICD-10-CM | POA: Diagnosis not present

## 2016-06-18 DIAGNOSIS — Z79899 Other long term (current) drug therapy: Secondary | ICD-10-CM | POA: Diagnosis not present

## 2016-06-19 LAB — HEPATIC FUNCTION PANEL
ALK PHOS: 41 U/L (ref 25–125)
ALT: 8 U/L (ref 7–35)
AST: 17 U/L (ref 13–35)
BILIRUBIN, TOTAL: 0.4 mg/dL

## 2016-06-19 LAB — BASIC METABOLIC PANEL
BUN: 12 mg/dL (ref 4–21)
Creatinine: 0.8 mg/dL (ref 0.5–1.1)
Glucose: 96 mg/dL
Potassium: 4 mmol/L (ref 3.4–5.3)
Sodium: 137 mmol/L (ref 137–147)

## 2016-06-19 LAB — CBC AND DIFFERENTIAL
HEMATOCRIT: 40 % (ref 36–46)
Hemoglobin: 13.2 g/dL (ref 12.0–16.0)
PLATELETS: 223 10*3/uL (ref 150–399)
WBC: 5.7 10^3/mL

## 2016-07-03 DIAGNOSIS — H903 Sensorineural hearing loss, bilateral: Secondary | ICD-10-CM | POA: Diagnosis not present

## 2016-07-03 DIAGNOSIS — H9311 Tinnitus, right ear: Secondary | ICD-10-CM | POA: Diagnosis not present

## 2016-08-07 DIAGNOSIS — E559 Vitamin D deficiency, unspecified: Secondary | ICD-10-CM | POA: Diagnosis not present

## 2016-08-07 DIAGNOSIS — M069 Rheumatoid arthritis, unspecified: Secondary | ICD-10-CM | POA: Diagnosis not present

## 2016-08-07 DIAGNOSIS — E890 Postprocedural hypothyroidism: Secondary | ICD-10-CM | POA: Diagnosis not present

## 2016-08-07 DIAGNOSIS — I10 Essential (primary) hypertension: Secondary | ICD-10-CM | POA: Diagnosis not present

## 2016-08-08 DIAGNOSIS — Z6827 Body mass index (BMI) 27.0-27.9, adult: Secondary | ICD-10-CM | POA: Diagnosis not present

## 2016-08-08 DIAGNOSIS — Z1231 Encounter for screening mammogram for malignant neoplasm of breast: Secondary | ICD-10-CM | POA: Diagnosis not present

## 2016-08-08 DIAGNOSIS — Z01419 Encounter for gynecological examination (general) (routine) without abnormal findings: Secondary | ICD-10-CM | POA: Diagnosis not present

## 2016-08-22 DIAGNOSIS — F411 Generalized anxiety disorder: Secondary | ICD-10-CM | POA: Diagnosis not present

## 2016-09-05 DIAGNOSIS — Z13220 Encounter for screening for lipoid disorders: Secondary | ICD-10-CM | POA: Diagnosis not present

## 2016-09-05 DIAGNOSIS — M069 Rheumatoid arthritis, unspecified: Secondary | ICD-10-CM | POA: Diagnosis not present

## 2016-09-05 DIAGNOSIS — I10 Essential (primary) hypertension: Secondary | ICD-10-CM | POA: Diagnosis not present

## 2016-09-05 DIAGNOSIS — E890 Postprocedural hypothyroidism: Secondary | ICD-10-CM | POA: Diagnosis not present

## 2016-09-05 DIAGNOSIS — E559 Vitamin D deficiency, unspecified: Secondary | ICD-10-CM | POA: Diagnosis not present

## 2016-11-08 DIAGNOSIS — Z79899 Other long term (current) drug therapy: Secondary | ICD-10-CM | POA: Insufficient documentation

## 2016-11-08 DIAGNOSIS — M0579 Rheumatoid arthritis with rheumatoid factor of multiple sites without organ or systems involvement: Secondary | ICD-10-CM | POA: Insufficient documentation

## 2016-11-08 NOTE — Progress Notes (Signed)
Office Visit Note  Patient: Tracey Nichols             Date of Birth: 1971/08/08           MRN: [id number]             PCP: Anthoney Harada, MD Referring: Aretta Nip, MD Visit Date: 11/13/2016 Occupation: '@GUAROCC'$ @    Subjective:  No chief complaint on file. Follow-up on rheumatoid arthritis and high risk prescription  History of Present Illness: Tracey Nichols is a 45 y.o. female  Last seen in our office 02/23/2016 Patient is doing well overall but her main complaint today is that She had to stop her methotrexate due to side effects of GERD. Her family doctor put her on prescription strength Pepcid.  She did well with the Pepcid and she had stopped her methotrexate for 3 months. Once her GI symptoms resolved she restarted the methotrexate once again but her symptoms return for her GERD so she stopped it once again.  She did notice that after the 3 months stop it of methotrexate her joints started to feel little bit bad. There was no swelling no redness but some stiffness.  When she restarted the medication the symptoms resolved.  Today she is not having any symptoms but she would like to restart the methotrexate.   Last week the left knee joint was bothering her somewhat swelling and pain but then it resolved on its own in 2 days.  Note that she is not on methotrexate for the last 3-4 weeks.  Activities of Daily Living:  Patient reports morning stiffness for 15 minutes.   Patient Denies nocturnal pain.  Difficulty dressing/grooming: Denies Difficulty climbing stairs: Denies Difficulty getting out of chair: Denies Difficulty using hands for taps, buttons, cutlery, and/or writing: Denies   Review of Systems  Constitutional: Negative for fatigue.  HENT: Negative for mouth sores and mouth dryness.   Eyes: Negative for dryness.  Respiratory: Negative for shortness of breath.   Gastrointestinal: Negative for constipation and diarrhea.  Musculoskeletal:  Negative for myalgias and myalgias.  Skin: Negative for sensitivity to sunlight.  Psychiatric/Behavioral: Negative for decreased concentration and sleep disturbance.    PMFS History:  Patient Active Problem List   Diagnosis Date Noted  . Rheumatoid arthritis with rheumatoid factor of multiple sites without organ or systems involvement ([place]) 11/08/2016  . High risk medication use 11/08/2016  . Dyspnea 10/14/2013    Past Medical History:  Diagnosis Date  . Acute blood loss anemia 04/30/2012  . Anemia    with 1st pregnancy  . 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 GAD (generalized anxiety disorder)   . GERD (gastroesophageal reflux disease) 2003   years ago - no meds now  . Hypertension    since age 3- usually ok when pregnant  . Hyperthyroidism    Had hyperthyroidism in first pregnancy, subsequently treated with radioiodine  . Hypothyroidism    s/p radioiodine treatment for hyperthyroidism  . Hypothyroidism complicating pregnancy 07/25/4234  . Migraines   . PP care - s/p C/S 5/7 - twins 04/29/2012  . Rheumatoid arthritis(714.0)   . Seizure ([place])   . Throat pain     Family History  Problem Relation Age of Onset  . Goiter Mother   . Migraines Mother   . Hyperlipidemia Mother   . Hypertension Father     paternal grandmother  . Asthma Brother   . Heart  disease      both sets of grandparents  . Rheum arthritis Sister   . Lung cancer Paternal Grandfather   . Diabetes Paternal Grandmother   . Arthritis Maternal Grandmother    Past Surgical History:  Procedure Laterality Date  . CESAREAN SECTION    . CESAREAN SECTION  04/29/2012   Procedure: CESAREAN SECTION;  Surgeon: Lovenia Kim, MD;  Location: [facility];  Service: Gynecology;  Laterality: N/A;  Repeat Cesarean Section; Twins   Social History   Social History Narrative  . No narrative on file     Objective: Vital Signs: BP (!) 133/97 (BP Location: Left Arm,  Patient Position: Sitting, Cuff Size: Small)   Pulse 76   Resp 13   Ht '5\' 2"'$  (1.575 m)   Wt 154 lb (69.9 kg)   BMI 28.17 kg/m    Physical Exam  Constitutional: She is oriented to person, place, and time. She appears well-developed and well-nourished.  HENT:  Head: Normocephalic and atraumatic.  Eyes: EOM are normal. Pupils are equal, round, and reactive to light.  Cardiovascular: Normal rate, regular rhythm and normal heart sounds.  Exam reveals no gallop and no friction rub.   No murmur heard. Pulmonary/Chest: Effort normal and breath sounds normal. She has no wheezes. She has no rales.  Abdominal: Soft. Bowel sounds are normal. She exhibits no distension. There is no tenderness. There is no guarding. No hernia.  Musculoskeletal: Normal range of motion. She exhibits no edema, tenderness or deformity.  Lymphadenopathy:    She has no cervical adenopathy.  Neurological: She is alert and oriented to person, place, and time. Coordination normal.  Skin: Skin is warm and dry. Capillary refill takes less than 2 seconds. No rash noted.  Psychiatric: She has a normal mood and affect. Her behavior is normal.     Musculoskeletal Exam:  Full range of motion of all joints except for some left knee joint discomfort Fiber myalgia tender points are all absent Grip strength is equal and strong bilaterally  CDAI Exam: CDAI Homunculus Exam:   Joint Counts:  CDAI Tender Joint count: 0 CDAI Swollen Joint count: 0     Investigation: Findings:  Labs from  Shows 06/19/16 CMP with GFR normal Last TB gold 2015, this is due.Ordered today Patient will discuss with PCP as needed.  CBC with diff is normal.02/23/16 her  Rapid 3 shows a raw score of 11.7 with an index of 3.7, which is consistent with moderate severity of her RA.       Imaging: No results found.  Speciality Comments: No specialty comments available.    Procedures:  No procedures performed Allergies: Propylthiouracil; Wellbutrin  [bupropion hcl]; and Sulfa antibiotics   Assessment / Plan: Visit Diagnoses: Gastroesophageal reflux disease, esophagitis presence not specified  Rheumatoid arthritis with rheumatoid factor of multiple sites without organ or systems involvement (HCC)  High risk medication use - Plan: Quantiferon tb gold assay (blood), CBC with Differential/Platelet, COMPLETE METABOLIC PANEL WITH GFR, CBC with Differential/Platelet, COMPLETE METABOLIC PANEL WITH GFR  Osteoarthritis of lumbar spine, unspecified spinal osteoarthritis complication status   Plan: We will switch the patient from oral methotrexate injectable methotrexate in at 0.7 mL's once a week.  Plan: Continue folic acid  Plan: Return to clinic in 3 months  Plan: Use Voltaren gel to the right shoulder joint and the left knee joint  CBC with differential CMP with GFR and TB goal today  Standing orders for CBC with differential CMP with GFR  Plan: If patient is doing well with 0.7 mL's per week; consider doing 0.6 mL's every week.  Plan: Continue Pepcid daily Orders: Orders Placed This Encounter  Procedures  . Quantiferon tb gold assay (blood)  . CBC with Differential/Platelet  . COMPLETE METABOLIC PANEL WITH GFR  . CBC with Differential/Platelet  . COMPLETE METABOLIC PANEL WITH GFR   Meds ordered this encounter  Medications  . diclofenac sodium (VOLTAREN) 1 % GEL    Sig: Voltaren Gel 3 grams to 3 large joints upto TID 3 TUBES with 3 refills    Dispense:  3 Tube    Refill:  3    Voltaren Gel 3 grams to 3 large joints upto TID 3 TUBES with 3 refills    Order Specific Question:   Supervising Provider    Answer:   Bo Merino [[id number]]  . methotrexate 50 MG/2ML injection    Sig: 0.63m every week subcutaneious    Dispense:  10 mL    Refill:  0    Order Specific Question:   Supervising Provider    Answer:   DBo Merino[[id number]]  . Tuberculin-Allergy Syringes 27G X 1/2" 1 ML KIT    Sig: Inject 1 Syringe into the skin  once a week. If syringe size greater than 128m please call me at my office.    Dispense:  12 each    Refill:  4    Order Specific Question:   Supervising Provider    Answer:   DEBo [id number]  Face-to-face time spent with patient was 30 minutes. 50% of time was spent in counseling and coordination of care.  Follow-Up Instructions: Return in about 3 months (around 02/13/2017) for RA, HRRX, gerd, ddd L spine,recheck if MTXinj  helping GERD sxs.   NaEliezer LoftsPA-C   I examined and evaluated the patient with NaEliezer LoftsA. The plan of care was discussed as noted above.  ShBo MerinoMD

## 2016-11-12 ENCOUNTER — Encounter: Payer: Self-pay | Admitting: *Deleted

## 2016-11-13 ENCOUNTER — Ambulatory Visit (INDEPENDENT_AMBULATORY_CARE_PROVIDER_SITE_OTHER): Payer: BLUE CROSS/BLUE SHIELD | Admitting: Rheumatology

## 2016-11-13 ENCOUNTER — Ambulatory Visit: Payer: BLUE CROSS/BLUE SHIELD | Admitting: Rheumatology

## 2016-11-13 ENCOUNTER — Encounter: Payer: Self-pay | Admitting: Rheumatology

## 2016-11-13 VITALS — BP 133/97 | HR 76 | Resp 13 | Ht 62.0 in | Wt 154.0 lb

## 2016-11-13 DIAGNOSIS — M47816 Spondylosis without myelopathy or radiculopathy, lumbar region: Secondary | ICD-10-CM

## 2016-11-13 DIAGNOSIS — M0579 Rheumatoid arthritis with rheumatoid factor of multiple sites without organ or systems involvement: Secondary | ICD-10-CM

## 2016-11-13 DIAGNOSIS — K219 Gastro-esophageal reflux disease without esophagitis: Secondary | ICD-10-CM

## 2016-11-13 DIAGNOSIS — Z79899 Other long term (current) drug therapy: Secondary | ICD-10-CM

## 2016-11-13 LAB — COMPLETE METABOLIC PANEL WITH GFR
ALK PHOS: 43 U/L (ref 33–115)
ALT: 8 U/L (ref 6–29)
AST: 16 U/L (ref 10–30)
Albumin: 4 g/dL (ref 3.6–5.1)
BUN: 13 mg/dL (ref 7–25)
CHLORIDE: 102 mmol/L (ref 98–110)
CO2: 26 mmol/L (ref 20–31)
Calcium: 9 mg/dL (ref 8.6–10.2)
Creat: 0.8 mg/dL (ref 0.50–1.10)
Glucose, Bld: 82 mg/dL (ref 65–99)
POTASSIUM: 4.3 mmol/L (ref 3.5–5.3)
Sodium: 137 mmol/L (ref 135–146)
Total Bilirubin: 0.4 mg/dL (ref 0.2–1.2)
Total Protein: 6.9 g/dL (ref 6.1–8.1)

## 2016-11-13 LAB — CBC WITH DIFFERENTIAL/PLATELET
BASOS ABS: 0 {cells}/uL (ref 0–200)
Basophils Relative: 0 %
Eosinophils Absolute: 196 cells/uL (ref 15–500)
Eosinophils Relative: 4 %
HCT: 39.6 % (ref 35.0–45.0)
HEMOGLOBIN: 13.1 g/dL (ref 11.7–15.5)
LYMPHS ABS: 1470 {cells}/uL (ref 850–3900)
LYMPHS PCT: 30 %
MCH: 31.3 pg (ref 27.0–33.0)
MCHC: 33.1 g/dL (ref 32.0–36.0)
MCV: 94.5 fL (ref 80.0–100.0)
MPV: 10.2 fL (ref 7.5–12.5)
Monocytes Absolute: 539 cells/uL (ref 200–950)
Monocytes Relative: 11 %
NEUTROS PCT: 55 %
Neutro Abs: 2695 cells/uL (ref 1500–7800)
Platelets: 275 10*3/uL (ref 140–400)
RBC: 4.19 MIL/uL (ref 3.80–5.10)
RDW: 14 % (ref 11.0–15.0)
WBC: 4.9 10*3/uL (ref 3.8–10.8)

## 2016-11-13 MED ORDER — METHOTREXATE SODIUM CHEMO INJECTION 50 MG/2ML: INTRAMUSCULAR | 0 refills | Status: DC

## 2016-11-13 MED ORDER — "TUBERCULIN-ALLERGY SYRINGES 27G X 1/2"" 1 ML KIT": 1.0000 | PACK | 4 refills | Status: DC

## 2016-11-13 MED ORDER — DICLOFENAC SODIUM 1 % TD GEL: TRANSDERMAL | 3 refills | Status: DC

## 2016-11-15 LAB — QUANTIFERON TB GOLD ASSAY (BLOOD)
INTERFERON GAMMA RELEASE ASSAY: NEGATIVE
Mitogen-Nil: 5.04 IU/mL
QUANTIFERON NIL VALUE: 0.01 [IU]/mL
Quantiferon Tb Ag Minus Nil Value: 0.01 IU/mL

## 2016-11-19 ENCOUNTER — Telehealth: Payer: Self-pay | Admitting: Radiology

## 2016-11-19 ENCOUNTER — Telehealth: Payer: Self-pay | Admitting: Rheumatology

## 2016-11-19 NOTE — Telephone Encounter (Signed)
Patient would like to know about most recent lab results

## 2016-11-19 NOTE — Progress Notes (Signed)
Informed patient TB gold  is negative please

## 2016-11-19 NOTE — Telephone Encounter (Signed)
Called patient to advise labs are normal  

## 2016-11-19 NOTE — Telephone Encounter (Signed)
-----   Message from Tawni Pummel, [place] sent at 11/19/2016  9:35 AM EST ----- Informed patient TB gold  is negative please

## 2016-11-21 DIAGNOSIS — F411 Generalized anxiety disorder: Secondary | ICD-10-CM | POA: Diagnosis not present

## 2016-11-22 DIAGNOSIS — J988 Other specified respiratory disorders: Secondary | ICD-10-CM | POA: Diagnosis not present

## 2016-11-29 ENCOUNTER — Other Ambulatory Visit: Payer: Self-pay | Admitting: Family Medicine

## 2016-11-29 DIAGNOSIS — R1011 Right upper quadrant pain: Secondary | ICD-10-CM

## 2016-11-29 DIAGNOSIS — E890 Postprocedural hypothyroidism: Secondary | ICD-10-CM | POA: Diagnosis not present

## 2016-11-29 DIAGNOSIS — I10 Essential (primary) hypertension: Secondary | ICD-10-CM | POA: Diagnosis not present

## 2016-11-29 DIAGNOSIS — E559 Vitamin D deficiency, unspecified: Secondary | ICD-10-CM | POA: Diagnosis not present

## 2016-11-29 DIAGNOSIS — M069 Rheumatoid arthritis, unspecified: Secondary | ICD-10-CM | POA: Diagnosis not present

## 2016-12-03 DIAGNOSIS — E890 Postprocedural hypothyroidism: Secondary | ICD-10-CM | POA: Diagnosis not present

## 2016-12-07 ENCOUNTER — Other Ambulatory Visit: Payer: BLUE CROSS/BLUE SHIELD

## 2016-12-13 ENCOUNTER — Ambulatory Visit
Admission: RE | Admit: 2016-12-13 | Discharge: 2016-12-13 | Disposition: A | Discharge status: Home / Self Care | Payer: BLUE CROSS/BLUE SHIELD | Source: Ambulatory Visit | Attending: Family Medicine | Admitting: Family Medicine

## 2016-12-13 DIAGNOSIS — R1011 Right upper quadrant pain: Secondary | ICD-10-CM | POA: Diagnosis not present

## 2016-12-27 ENCOUNTER — Ambulatory Visit: Payer: BLUE CROSS/BLUE SHIELD

## 2016-12-27 ENCOUNTER — Encounter: Payer: Self-pay | Admitting: Pharmacist

## 2016-12-27 NOTE — Progress Notes (Signed)
Pharmacy Note Subjective: Patient presents today to the [facility] for education on injectable methotrexate.    Objective: CBC    Component Value Date/Time   WBC 4.9 11/13/2016 0958   RBC 4.19 11/13/2016 0958   HGB 13.1 11/13/2016 0958   HCT 39.6 11/13/2016 0958   PLT 275 11/13/2016 0958   MCV 94.5 11/13/2016 0958   MCH 31.3 11/13/2016 0958   MCHC 33.1 11/13/2016 0958   RDW 14.0 11/13/2016 0958   LYMPHSABS 1,470 11/13/2016 0958   MONOABS 539 11/13/2016 0958   EOSABS 196 11/13/2016 0958   BASOSABS 0 11/13/2016 0958    CMP     Component Value Date/Time   NA 137 11/13/2016 0958   NA 137 06/19/2016   K 4.3 11/13/2016 0958   CL 102 11/13/2016 0958   CO2 26 11/13/2016 0958   GLUCOSE 82 11/13/2016 0958   BUN 13 11/13/2016 0958   BUN 12 06/19/2016   CREATININE 0.80 11/13/2016 0958   CALCIUM 9.0 11/13/2016 0958   PROT 6.9 11/13/2016 0958   ALBUMIN 4.0 11/13/2016 0958   AST 16 11/13/2016 0958   ALT 8 11/13/2016 0958   ALKPHOS 43 11/13/2016 0958   BILITOT 0.4 11/13/2016 0958   GFRNONAA >89 11/13/2016 0958   GFRAA >89 11/13/2016 0958    TB Gold: negative (11/13/16) Hepatitis panel: negative (10/03/10) HIV: negative (08/19/13)  Chest-xray:  01/08/14   Assessment/Plan:  Patient was recently prescribed methotrexate 0.7 mL SQ weekly on 11/13/16.  Patient reports she had not initiated the injectable methotrexate yet as she was waiting until the beginning of the year.  She came to clinic today for education.  I reviewed the purpose, proper use, and adverse effects of methotrexate.  Discussed importance of taking folic acid with methotrexate.  Patient confirms she plans to start taking the folic acid 2 mg daily.  Discussed the importance of frequent monitoring of kidney and liver function and blood counts.  Standing lab orders were placed at 11/13/16 visit.  Advised patient to get standing labs two weeks after starting methotrexate.  Patient has consented to  methotrexate use (consent signed on 07/19/15).    Educated patient on how to use a vial and syringe and reviewed injection technique with patient.  Patient was able to demonstrate proper technique for injections using vial and syringe.  She brought her prescription of methotrexate with her to the visit.  She self-injected methotrexate into her left thigh and tolerated the injection well.  Lot [id number], Exp 11/18.  Provided patient on educational material regarding injection technique and storage of methotrexate.  She denied any questions or concerns at this time.     Lilla Shook, Pharm.D., BCPS Clinical Pharmacist Pager: [phone] Phone: [phone] 12/27/2016 2:29 PM

## 2017-01-25 ENCOUNTER — Other Ambulatory Visit: Payer: Self-pay | Admitting: *Deleted

## 2017-01-25 DIAGNOSIS — Z79899 Other long term (current) drug therapy: Secondary | ICD-10-CM | POA: Diagnosis not present

## 2017-01-25 LAB — CBC WITH DIFFERENTIAL/PLATELET
Basophils Absolute: 56 cells/uL (ref 0–200)
Basophils Relative: 1 %
Eosinophils Absolute: 168 cells/uL (ref 15–500)
Eosinophils Relative: 3 %
HEMATOCRIT: 39.4 % (ref 35.0–45.0)
Hemoglobin: 13 g/dL (ref 11.7–15.5)
Lymphocytes Relative: 21 %
Lymphs Abs: 1176 cells/uL (ref 850–3900)
MCH: 31.3 pg (ref 27.0–33.0)
MCHC: 33 g/dL (ref 32.0–36.0)
MCV: 94.9 fL (ref 80.0–100.0)
MPV: 9.8 fL (ref 7.5–12.5)
Monocytes Absolute: 560 cells/uL (ref 200–950)
Monocytes Relative: 10 %
Neutro Abs: 3640 cells/uL (ref 1500–7800)
Neutrophils Relative %: 65 %
PLATELETS: 244 10*3/uL (ref 140–400)
RBC: 4.15 MIL/uL (ref 3.80–5.10)
RDW: 14.7 % (ref 11.0–15.0)
WBC: 5.6 10*3/uL (ref 3.8–10.8)

## 2017-01-25 LAB — COMPLETE METABOLIC PANEL WITH GFR
ALT: 11 U/L (ref 6–29)
AST: 17 U/L (ref 10–30)
Albumin: 4 g/dL (ref 3.6–5.1)
Alkaline Phosphatase: 49 U/L (ref 33–115)
BUN: 14 mg/dL (ref 7–25)
CHLORIDE: 103 mmol/L (ref 98–110)
CO2: 28 mmol/L (ref 20–31)
Calcium: 9.1 mg/dL (ref 8.6–10.2)
Creat: 0.79 mg/dL (ref 0.50–1.10)
GFR, Est African American: >89 mL/min (ref >=60)
GFR, Est Non African American: >89 mL/min (ref >=60)
Glucose, Bld: 76 mg/dL (ref 65–99)
Potassium: 4.4 mmol/L (ref 3.5–5.3)
Sodium: 139 mmol/L (ref 135–146)
Total Bilirubin: 0.4 mg/dL (ref 0.2–1.2)
Total Protein: 7.2 g/dL (ref 6.1–8.1)

## 2017-02-06 DIAGNOSIS — E890 Postprocedural hypothyroidism: Secondary | ICD-10-CM | POA: Diagnosis not present

## 2017-02-07 DIAGNOSIS — R4781 Slurred speech: Secondary | ICD-10-CM | POA: Diagnosis not present

## 2017-02-07 DIAGNOSIS — E538 Deficiency of other specified B group vitamins: Secondary | ICD-10-CM | POA: Diagnosis not present

## 2017-02-07 DIAGNOSIS — G518 Other disorders of facial nerve: Secondary | ICD-10-CM | POA: Diagnosis not present

## 2017-02-07 DIAGNOSIS — G5603 Carpal tunnel syndrome, bilateral upper limbs: Secondary | ICD-10-CM | POA: Diagnosis not present

## 2017-02-07 DIAGNOSIS — R202 Paresthesia of skin: Secondary | ICD-10-CM | POA: Diagnosis not present

## 2017-02-07 DIAGNOSIS — M542 Cervicalgia: Secondary | ICD-10-CM | POA: Diagnosis not present

## 2017-02-07 DIAGNOSIS — G609 Hereditary and idiopathic neuropathy, unspecified: Secondary | ICD-10-CM | POA: Diagnosis not present

## 2017-02-07 DIAGNOSIS — R270 Ataxia, unspecified: Secondary | ICD-10-CM | POA: Diagnosis not present

## 2017-02-07 DIAGNOSIS — M5417 Radiculopathy, lumbosacral region: Secondary | ICD-10-CM | POA: Diagnosis not present

## 2017-02-07 DIAGNOSIS — M5481 Occipital neuralgia: Secondary | ICD-10-CM | POA: Diagnosis not present

## 2017-02-07 DIAGNOSIS — M5412 Radiculopathy, cervical region: Secondary | ICD-10-CM | POA: Diagnosis not present

## 2017-02-08 DIAGNOSIS — E890 Postprocedural hypothyroidism: Secondary | ICD-10-CM | POA: Diagnosis not present

## 2017-02-12 DIAGNOSIS — Z8719 Personal history of other diseases of the digestive system: Secondary | ICD-10-CM | POA: Insufficient documentation

## 2017-02-13 DIAGNOSIS — M47816 Spondylosis without myelopathy or radiculopathy, lumbar region: Secondary | ICD-10-CM | POA: Insufficient documentation

## 2017-02-13 NOTE — Progress Notes (Deleted)
Office Visit Note  Patient: Tracey Nichols             Date of Birth: 05-Jul-1971           MRN: [id number]             PCP: Anthoney Harada, MD Referring: Vernie Shanks, MD Visit Date: 02/14/2017 Occupation: '@GUAROCC'$ @    Subjective:  No chief complaint on file.   History of Present Illness: Tracey Nichols is a 46 y.o. female ***   Activities of Daily Living:  Patient reports morning stiffness for *** {minute/hour:19697}.   Patient {ACTIONS;DENIES/REPORTS:21021675::"Denies"} nocturnal pain.  Difficulty dressing/grooming: {ACTIONS;DENIES/REPORTS:21021675::"Denies"} Difficulty climbing stairs: {ACTIONS;DENIES/REPORTS:21021675::"Denies"} Difficulty getting out of chair: {ACTIONS;DENIES/REPORTS:21021675::"Denies"} Difficulty using hands for taps, buttons, cutlery, and/or writing: {ACTIONS;DENIES/REPORTS:21021675::"Denies"}   Review of Systems  Constitutional: Negative for fatigue.  HENT: Negative for mouth sores and mouth dryness.   Eyes: Negative for dryness.  Respiratory: Negative for shortness of breath.   Gastrointestinal: Negative for constipation and diarrhea.  Musculoskeletal: Negative for myalgias and myalgias.  Skin: Negative for sensitivity to sunlight.  Psychiatric/Behavioral: Negative for decreased concentration and sleep disturbance.    PMFS History:  Patient Active Problem List   Diagnosis Date Noted  . Osteoarthritis of lumbar spine 02/13/2017  . History of gastroesophageal reflux (GERD) 02/12/2017  . Rheumatoid arthritis with rheumatoid factor of multiple sites without organ or systems involvement ([place]) 11/08/2016  . High risk medication use 11/08/2016  . Dyspnea 10/14/2013    Past Medical History:  Diagnosis Date  . Acute blood loss anemia 04/30/2012  . Anemia    with 1st pregnancy  . 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 GAD (generalized anxiety disorder)   . GERD (gastroesophageal  reflux disease) 2003   years ago - no meds now  . Hypertension    since age 37- usually ok when pregnant  . Hyperthyroidism    Had hyperthyroidism in first pregnancy, subsequently treated with radioiodine  . Hypothyroidism    s/p radioiodine treatment for hyperthyroidism  . Hypothyroidism complicating pregnancy 07/01/2955  . Migraines   . PP care - s/p C/S 5/7 - twins 04/29/2012  . Rheumatoid arthritis(714.0)   . Seizure ([place])   . Throat pain     Family History  Problem Relation Age of Onset  . Goiter Mother   . Migraines Mother   . Hyperlipidemia Mother   . Hypertension Father     paternal grandmother  . Asthma Brother   . Heart disease      both sets of grandparents  . Rheum arthritis Sister   . Lung cancer Paternal Grandfather   . Diabetes Paternal Grandmother   . Arthritis Maternal Grandmother    Past Surgical History:  Procedure Laterality Date  . CESAREAN SECTION    . CESAREAN SECTION  04/29/2012   Procedure: CESAREAN SECTION;  Surgeon: Lovenia Kim, MD;  Location: [facility];  Service: Gynecology;  Laterality: N/A;  Repeat Cesarean Section; Twins   Social History   Social History Narrative  . No narrative on file     Objective: Vital Signs: There were no vitals taken for this visit.   Physical Exam  Constitutional: She is oriented to person, place, and time. She appears well-developed and well-nourished.  HENT:  Head: Normocephalic and atraumatic.  Eyes: EOM are normal. Pupils are equal, round, and reactive to light.  Cardiovascular: Normal rate, regular rhythm and normal heart sounds.  Exam  reveals no gallop and no friction rub.   No murmur heard. Pulmonary/Chest: Effort normal and breath sounds normal. She has no wheezes. She has no rales.  Abdominal: Soft. Bowel sounds are normal. She exhibits no distension. There is no tenderness. There is no guarding. No hernia.  Musculoskeletal: Normal range of motion. She exhibits no edema, tenderness or deformity.    Lymphadenopathy:    She has no cervical adenopathy.  Neurological: She is alert and oriented to person, place, and time. Coordination normal.  Skin: Skin is warm and dry. Capillary refill takes less than 2 seconds. No rash noted.  Psychiatric: She has a normal mood and affect. Her behavior is normal.  Nursing note and vitals reviewed.    Musculoskeletal Exam: ***  CDAI Exam: No CDAI exam completed.    Investigation: Findings:  Labs from  Shows 06/19/16 CMP with GFR normal,CBC with diff is normal.02/23/16 her  Rapid 3 shows a raw score of 11.7 with an index of 3.7, which is consistent with moderate severity of her RA  Last TB gold 2015, this is due.Ordered today Patient will discuss with PCP as needed  11/13/2016-CBC with differential CMP with GFR and TB gold  Orders Only on 01/25/2017  Component Date Value Ref Range Status  . WBC 01/25/2017 5.6  3.8 - 10.8 K/uL Final  . RBC 01/25/2017 4.15  3.80 - 5.10 MIL/uL Final  . Hemoglobin 01/25/2017 13.0  11.7 - 15.5 g/dL Final  . HCT 01/25/2017 39.4  35.0 - 45.0 % Final  . MCV 01/25/2017 94.9  80.0 - 100.0 fL Final  . MCH 01/25/2017 31.3  27.0 - 33.0 pg Final  . MCHC 01/25/2017 33.0  32.0 - 36.0 g/dL Final  . RDW 01/25/2017 14.7  11.0 - 15.0 % Final  . Platelets 01/25/2017 244  140 - 400 K/uL Final  . MPV 01/25/2017 9.8  7.5 - 12.5 fL Final  . Neutro Abs 01/25/2017 3640  1,500 - 7,800 cells/uL Final  . Lymphs Abs 01/25/2017 1176  850 - 3,900 cells/uL Final  . Monocytes Absolute 01/25/2017 560  200 - 950 cells/uL Final  . Eosinophils Absolute 01/25/2017 168  15 - 500 cells/uL Final  . Basophils Absolute 01/25/2017 56  0 - 200 cells/uL Final  . Neutrophils Relative % 01/25/2017 65  % Final  . Lymphocytes Relative 01/25/2017 21  % Final  . Monocytes Relative 01/25/2017 10  % Final  . Eosinophils Relative 01/25/2017 3  % Final  . Basophils Relative 01/25/2017 1  % Final  . Smear Review 01/25/2017 Criteria for review not met   Final  .  Sodium 01/25/2017 139  135 - 146 mmol/L Final  . Potassium 01/25/2017 4.4  3.5 - 5.3 mmol/L Final  . Chloride 01/25/2017 103  98 - 110 mmol/L Final  . CO2 01/25/2017 28  20 - 31 mmol/L Final  . Glucose, Bld 01/25/2017 76  65 - 99 mg/dL Final  . BUN 01/25/2017 14  7 - 25 mg/dL Final  . Creat 01/25/2017 0.79  0.50 - 1.10 mg/dL Final  . Total Bilirubin 01/25/2017 0.4  0.2 - 1.2 mg/dL Final  . Alkaline Phosphatase 01/25/2017 49  33 - 115 U/L Final  . AST 01/25/2017 17  10 - 30 U/L Final  . ALT 01/25/2017 11  6 - 29 U/L Final  . Total Protein 01/25/2017 7.2  6.1 - 8.1 g/dL Final  . Albumin 01/25/2017 4.0  3.6 - 5.1 g/dL Final  . Calcium 01/25/2017 9.1  8.6 - 10.2 mg/dL Final  . GFR, Est African American 01/25/2017 >89  >=60 mL/min Final  . GFR, Est Non African American 01/25/2017 >89  >=60 mL/min Final  Office Visit on 11/13/2016  Component Date Value Ref Range Status  . Interferon Gamma Release Assay 11/13/2016 NEGATIVE  NEGATIVE Final  . Quantiferon Nil Value 11/13/2016 0.01  IU/mL Final  . Mitogen-Nil 11/13/2016 5.04  IU/mL Final  . Quantiferon Tb Ag Minus Nil Value 11/13/2016 0.01  IU/mL Final   Comment:   The Nil tube value is used to determine if the patient has a preexisting immune response which could cause a false-positive reading on the test. In order for a test to be valid, the Nil tube must have a value of less than or equal to 8.0 IU/mL.   The mitogen control tube is used to assure the patient has a healthy immune status and also serves as a control for correct blood handling and incubation. It is used to detect false-negative readings. The mitogen tube must have a gamma interferon value of greater than or equal to 0.5 IU/mL higher than the value of the Nil tube.   The TB antigen tube is coated with the M. tuberculosis specific antigens. For a test to be considered positive, the TB antigen tube value minus the Nil tube value must be greater than or equal to 0.35 IU/mL.     For additional information, please refer to http://education.questdiagn[facility].com/faq/QFT (This link is being provided for informational/educational purposes only.)   . WBC 11/13/2016 4.9  3.8 - 10.8 K/uL Final  . RBC 11/13/2016 4.19  3.80 - 5.10 MIL/uL Final  . Hemoglobin 11/13/2016 13.1  11.7 - 15.5 g/dL Final  . HCT 11/13/2016 39.6  35.0 - 45.0 % Final  . MCV 11/13/2016 94.5  80.0 - 100.0 fL Final  . MCH 11/13/2016 31.3  27.0 - 33.0 pg Final  . MCHC 11/13/2016 33.1  32.0 - 36.0 g/dL Final  . RDW 11/13/2016 14.0  11.0 - 15.0 % Final  . Platelets 11/13/2016 275  140 - 400 K/uL Final  . MPV 11/13/2016 10.2  7.5 - 12.5 fL Final  . Neutro Abs 11/13/2016 2695  1,500 - 7,800 cells/uL Final  . Lymphs Abs 11/13/2016 1470  850 - 3,900 cells/uL Final  . Monocytes Absolute 11/13/2016 539  200 - 950 cells/uL Final  . Eosinophils Absolute 11/13/2016 196  15 - 500 cells/uL Final  . Basophils Absolute 11/13/2016 0  0 - 200 cells/uL Final  . Neutrophils Relative % 11/13/2016 55  % Final  . Lymphocytes Relative 11/13/2016 30  % Final  . Monocytes Relative 11/13/2016 11  % Final  . Eosinophils Relative 11/13/2016 4  % Final  . Basophils Relative 11/13/2016 0  % Final  . Smear Review 11/13/2016 Criteria for review not met   Final  . Sodium 11/13/2016 137  135 - 146 mmol/L Final  . Potassium 11/13/2016 4.3  3.5 - 5.3 mmol/L Final  . Chloride 11/13/2016 102  98 - 110 mmol/L Final  . CO2 11/13/2016 26  20 - 31 mmol/L Final  . Glucose, Bld 11/13/2016 82  65 - 99 mg/dL Final  . BUN 11/13/2016 13  7 - 25 mg/dL Final  . Creat 11/13/2016 0.80  0.50 - 1.10 mg/dL Final  . Total Bilirubin 11/13/2016 0.4  0.2 - 1.2 mg/dL Final  . Alkaline Phosphatase 11/13/2016 43  33 - 115 U/L Final  . AST 11/13/2016 16  10 - 30 U/L  Final  . ALT 11/13/2016 8  6 - 29 U/L Final  . Total Protein 11/13/2016 6.9  6.1 - 8.1 g/dL Final  . Albumin 11/13/2016 4.0  3.6 - 5.1 g/dL Final  . Calcium 11/13/2016 9.0  8.6 - 10.2 mg/dL  Final  . GFR, Est African American 11/13/2016 >89  >=60 mL/min Final  . GFR, Est Non African American 11/13/2016 >89  >=60 mL/min Final  Abstract on 11/12/2016  Component Date Value Ref Range Status  . Hemoglobin 06/19/2016 13.2  12.0 - 16.0 g/dL Final  . HCT 06/19/2016 40  36 - 46 % Final  . Platelets 06/19/2016 223  150 - 399 K/L Final  . WBC 06/19/2016 5.7  10^3/mL Final  . Glucose 06/19/2016 96  mg/dL Final  . BUN 06/19/2016 12  4 - 21 mg/dL Final  . Creatinine 06/19/2016 0.8  0.5 - 1.1 mg/dL Final  . Potassium 06/19/2016 4.0  3.4 - 5.3 mmol/L Final  . Sodium 06/19/2016 137  137 - 147 mmol/L Final  . Alkaline Phosphatase 06/19/2016 41  25 - 125 U/L Final  . ALT 06/19/2016 8  7 - 35 U/L Final  . AST 06/19/2016 17  13 - 35 U/L Final  . Bilirubin, Total 06/19/2016 0.4  mg/dL Final      Imaging: No results found.  Speciality Comments: No specialty comments available.    Procedures:  No procedures performed Allergies: Propylthiouracil; Wellbutrin [bupropion hcl]; and Sulfa antibiotics   Assessment / Plan:     Visit Diagnoses: Rheumatoid arthritis with rheumatoid factor of multiple sites without organ or systems involvement ([place])  High risk medication use - MTX-0.7 mL's once a week.  History of gastroesophageal reflux (GERD)  Osteoarthritis of lumbar spine, unspecified spinal osteoarthritis complication status    Orders: No orders of the defined types were placed in this encounter.  No orders of the defined types were placed in this encounter.   Face-to-face time spent with patient was *** minutes. 50% of time was spent in counseling and coordination of care.  Follow-Up Instructions: No Follow-up on file.   Eliezer Lofts, PA-C  Note - This record has been created using [employer].  Chart creation errors have been sought, but may not always  have been located. Such creation errors do not reflect on  the standard of medical care.

## 2017-02-14 ENCOUNTER — Ambulatory Visit: Payer: BLUE CROSS/BLUE SHIELD | Admitting: Rheumatology

## 2017-02-21 ENCOUNTER — Other Ambulatory Visit: Payer: Self-pay | Admitting: Specialist

## 2017-02-21 DIAGNOSIS — M5412 Radiculopathy, cervical region: Secondary | ICD-10-CM

## 2017-02-21 DIAGNOSIS — R9082 White matter disease, unspecified: Secondary | ICD-10-CM

## 2017-02-28 DIAGNOSIS — R05 Cough: Secondary | ICD-10-CM | POA: Diagnosis not present

## 2017-03-06 ENCOUNTER — Other Ambulatory Visit: Payer: BLUE CROSS/BLUE SHIELD

## 2017-03-06 ENCOUNTER — Ambulatory Visit
Admission: RE | Admit: 2017-03-06 | Discharge: 2017-03-06 | Disposition: A | Discharge status: Home / Self Care | Payer: BLUE CROSS/BLUE SHIELD | Source: Ambulatory Visit | Attending: Specialist | Admitting: Specialist

## 2017-03-06 DIAGNOSIS — R9082 White matter disease, unspecified: Secondary | ICD-10-CM

## 2017-03-06 DIAGNOSIS — M50221 Other cervical disc displacement at C4-C5 level: Secondary | ICD-10-CM | POA: Diagnosis not present

## 2017-03-06 DIAGNOSIS — M50222 Other cervical disc displacement at C5-C6 level: Secondary | ICD-10-CM | POA: Diagnosis not present

## 2017-03-06 DIAGNOSIS — M5412 Radiculopathy, cervical region: Secondary | ICD-10-CM

## 2017-03-15 ENCOUNTER — Ambulatory Visit: Payer: BLUE CROSS/BLUE SHIELD | Admitting: Rheumatology

## 2017-03-19 ENCOUNTER — Ambulatory Visit: Payer: BLUE CROSS/BLUE SHIELD | Admitting: Rheumatology

## 2017-03-21 DIAGNOSIS — M5412 Radiculopathy, cervical region: Secondary | ICD-10-CM | POA: Diagnosis not present

## 2017-03-21 DIAGNOSIS — M5481 Occipital neuralgia: Secondary | ICD-10-CM | POA: Diagnosis not present

## 2017-03-21 DIAGNOSIS — G510 Bell's palsy: Secondary | ICD-10-CM | POA: Diagnosis not present

## 2017-03-21 DIAGNOSIS — G523 Disorders of hypoglossal nerve: Secondary | ICD-10-CM | POA: Diagnosis not present

## 2017-04-11 ENCOUNTER — Telehealth: Payer: Self-pay

## 2017-04-11 NOTE — Telephone Encounter (Signed)
Received a fax conformation from cover my meds regarding a prior authorization approval for Voltaren Gel from 4/191/18 to 12/23/2038.   Left message for patient to call back to update her.   Corina Stacy, [place], CPhT  12:12 PM

## 2017-04-12 ENCOUNTER — Telehealth: Payer: Self-pay | Admitting: Radiology

## 2017-04-12 NOTE — Telephone Encounter (Signed)
Completed by [place] yesterday.

## 2017-04-12 NOTE — Telephone Encounter (Signed)
PA request received via fax for V gel [id number] 02/16/1971

## 2017-04-15 NOTE — Progress Notes (Signed)
Office Visit Note  Patient: Tracey Nichols             Date of Birth: [birth year]           MRN: [id number]             PCP: Ileana Ladd, MD Referring: Ileana Ladd, MD Visit Date: 04/16/2017 Occupation: @GUAROCC @    Subjective:  No chief complaint on file.   History of Present Illness: Tracey Nichols is a 46 y.o. female  Last seen November 2017.  On that visit we had decided to start the patient on injectable methotrexate.  Although she started the medication, she ended up n January and had to discontinue methotrexate temporarily. Upper respiratory infectiwith not resolve and patient ended up and prednisone before he would go away.   As a result, patient was off of methotrexate and recently started it injectable methotrexate once again last week.  She starting to have a flare and she states that herright ankle with pain, tender, and discomfort with ambulation. (Hx of right ankle affected first when RA initially diagnosed)  Also some miomfort to bilateral wrist joints. No swelling but patient does feel some discomfort.   MTX 0.7ML/week (restarted last week due to  Folic acid 2mg  daily Advil or tylenol prn pain.  Activities of Daily Living:  Patient reports morning stiffness for 15 minutes.   Patient Reports nocturnal pain.  Difficulty dressing/grooming: Denies Difficulty climbing stairs: Reports Difficulty getting out of chair: Denies Difficulty using hands for taps, buttons, cutlery, and/or writing: Denies   Review of Systems  Constitutional: Negative for fatigue.  HENT: Negative for mouth sores and mouth dryness.   Eyes: Negative for dryness.  Respiratory: Negative for shortness of breath.   Gastrointestinal: Negative for constipation and diarrhea.  Musculoskeletal: Negative for myalgias and myalgias.  Skin: Negative for sensitivity to sunlight.  Psychiatric/Behavioral: Negative for decreased concentration and sleep disturbance.    PMFS History:    Patient Active Problem List   Diagnosis Date Noted  . Osteoarthritis of lumbar spine 02/13/2017  . History of gastroesophageal reflux (GERD) 02/12/2017  . Rheumatoid arthritis with rheumatoid factor of multiple sites without organ or systems involvement (HCC) 11/08/2016  . High risk medication use 11/08/2016  . Dyspnea 10/14/2013    Past Medical History:  Diagnosis Date  . Acute blood loss anemia 04/30/2012  . Anemia    with 1st pregnancy  . 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 GAD (generalized anxiety disorder)   . GERD (gastroesophageal reflux disease) 2003   years ago - no meds now  . Hypertension    since age 42- usually ok when pregnant  . Hyperthyroidism    Had hyperthyroidism in first pregnancy, subsequently treated with radioiodine  . Hypothyroidism    s/p radioiodine treatment for hyperthyroidism  . Hypothyroidism complicating pregnancy 04/30/2012  . Migraines   . PP care - s/p C/S 5/7 - twins 04/29/2012  . Rheumatoid arthritis(714.0)   . Seizure (HCC)   . Throat pain     Family History  Problem Relation Age of Onset  . Goiter Mother   . Migraines Mother   . Hyperlipidemia Mother   . Hypertension Father     paternal grandmother  . Asthma Brother   . Heart disease      both sets of grandparents  . Rheum arthritis Sister   . Lung cancer Paternal Grandfather   . Diabetes  Paternal Grandmother   . Arthritis Maternal Grandmother    Past Surgical History:  Procedure Laterality Date  . CESAREAN SECTION    . CESAREAN SECTION  04/29/2012   Procedure: CESAREAN SECTION;  Surgeon: Lenoard Aden, MD;  Location: [facility];  Service: Gynecology;  Laterality: N/A;  Repeat Cesarean Section; Twins   Social History   Social History Narrative  . No narrative on file     Objective: Vital Signs: BP 116/80 (BP Location: Left Arm, Patient Position: Sitting)   Pulse 73   Resp 14   Ht 5\' 2"  (1.575 m)   Wt 168 lb (76.2  kg)   BMI 30.73 kg/m    Physical Exam  Constitutional: She is oriented to person, place, and time. She appears well-developed and well-nourished.  HENT:  Head: Normocephalic and atraumatic.  Eyes: EOM are normal. Pupils are equal, round, and reactive to light.  Cardiovascular: Normal rate, regular rhythm and normal heart sounds.  Exam reveals no gallop and no friction rub.   No murmur heard. Pulmonary/Chest: Effort normal and breath sounds normal. She has no wheezes. She has no rales.  Abdominal: Soft. Bowel sounds are normal. She exhibits no distension. There is no tenderness. There is no guarding. No hernia.  Musculoskeletal: Normal range of motion. She exhibits no edema, tenderness or deformity.  Lymphadenopathy:    She has no cervical adenopathy.  Neurological: She is alert and oriented to person, place, and time. Coordination normal.  Skin: Skin is warm and dry. Capillary refill takes less than 2 seconds. No rash noted.  Psychiatric: She has a normal mood and affect. Her behavior is normal.  Nursing note and vitals reviewed.    Musculoskeletal Exam:  Full range of motion of all joints Grip strength equal and strong bilaterally FMS tender points are all absent.  CDAI Exam: CDAI Homunculus Exam:   Tenderness:  RUE: wrist LUE: wrist RLE: tibiotalar  Swelling:  RLE: tibiotalar  Joint Counts:  CDAI Tender Joint count: 2 CDAI Swollen Joint count: 0  Global Assessments:  Patient Global Assessment: 1 Provider Global Assessment: 1  CDAI Calculated Score: 4    Investigation: No additional findings.  TB Gold: negative (11/13/16) Hepatitis panel: negative (10/03/10) HIV: negative (08/19/13)  CBC    Component Value Date/Time   WBC 5.6 01/25/2017 0941   RBC 4.15 01/25/2017 0941   HGB 13.0 01/25/2017 0941   HCT 39.4 01/25/2017 0941   PLT 244 01/25/2017 0941   MCV 94.9 01/25/2017 0941   MCH 31.3 01/25/2017 0941   MCHC 33.0 01/25/2017 0941   RDW 14.7 01/25/2017  0941   LYMPHSABS 1,176 01/25/2017 0941   MONOABS 560 01/25/2017 0941   EOSABS 168 01/25/2017 0941   BASOSABS 56 01/25/2017 0941   CMP     Component Value Date/Time   NA 139 01/25/2017 0941   NA 137 06/19/2016   K 4.4 01/25/2017 0941   CL 103 01/25/2017 0941   CO2 28 01/25/2017 0941   GLUCOSE 76 01/25/2017 0941   BUN 14 01/25/2017 0941   BUN 12 06/19/2016   CREATININE 0.79 01/25/2017 0941   CALCIUM 9.1 01/25/2017 0941   PROT 7.2 01/25/2017 0941   ALBUMIN 4.0 01/25/2017 0941   AST 17 01/25/2017 0941   ALT 11 01/25/2017 0941   ALKPHOS 49 01/25/2017 0941   BILITOT 0.4 01/25/2017 0941   GFRNONAA >89 01/25/2017 0941   GFRAA >89 01/25/2017 0941    Imaging: No results found.  Speciality Comments: No specialty comments  available.    Procedures:  No procedures performed Allergies: Propylthiouracil; Wellbutrin [bupropion hcl]; and Sulfa antibiotics   Assessment / Plan:     Visit Diagnoses: Rheumatoid arthritis with rheumatoid factor of multiple sites without organ or systems involvement (HCC)  High risk medication use - MTX 0.7 mL Inj per week, folic acid 2 po qd - Plan: CBC with Differential/Platelet, COMPLETE METABOLIC PANEL WITH GFR  Other osteoarthritis of spine, lumbar region  History of gastroesophageal reflux (GERD)   Plan: #1: Rheumatoid arthritis. Patient had to suspend taking methotrexate for approximately 3 months while she was fighting an upper respiratory infection. She was placed on Zithromax initially. When she fell that, she was placed on Augmentin. She suspended her methotrexate during this time.Marland Kitchen She was without methotrexate approximately mid January until late April. She restarted her methotrexate at 0.7 mL's last week. She has minor discomfort to her right ankles and bilateral wrist currently with mild swelling and mild discomfort.  #2: High-risk prescription. Methotrexate 0.7 mL's every week, folic acid 2 mg every day. CBC with differential and  CMP with GFR due today  #3: Right ankle joint pain with mild swelling  #4: Bilateral wrist pain with no synovitis  #5: Patient will return to clinic in 3 months for repeat labs.  #6: Return to clinic in 5 months for regular follow-  #7: If she continues to do well and does not have any flare, We may consider decreasing her 0.7 mL's of methotrexate down to 0.6. That may take many months so we will be mindful of this for future.   Orders: Orders Placed This Encounter  Procedures  . CBC with Differential/Platelet  . COMPLETE METABOLIC PANEL WITH GFR   Meds ordered this encounter  Medications  . diclofenac sodium (VOLTAREN) 1 % GEL    Sig: Voltaren Gel 3 grams to 3 large joints upto TID 3 TUBES with 3 refills    Dispense:  3 Tube    Refill:  3    Voltaren Gel 3 grams to 3 large joints upto TID 3 TUBES with 3 refills    Order Specific Question:   Supervising Provider    Answer:   Pollyann Savoy [[id number]]  . methotrexate 50 MG/2ML injection    Sig: 0.81ml every week subcutaneious    Dispense:  10 mL    Refill:  0    Please dispense 90 day supply preservatives only    Order Specific Question:   Supervising Provider    Answer:   Pollyann Savoy [[id number]]    Face-to-face time spent with patient was 30 minutes. 50% of time was spent in counseling and coordination of care.  Follow-Up Instructions: Return in about 5 months (around 09/16/2017) for RA,.   Tawni Pummel, PA-C  Note - This record has been created using [employer].  Chart creation errors have been sought, but may not always  have been located. Such creation errors do not reflect on  the standard of medical care.

## 2017-04-16 ENCOUNTER — Telehealth: Payer: Self-pay | Admitting: Radiology

## 2017-04-16 ENCOUNTER — Encounter: Payer: Self-pay | Admitting: Rheumatology

## 2017-04-16 ENCOUNTER — Ambulatory Visit (INDEPENDENT_AMBULATORY_CARE_PROVIDER_SITE_OTHER): Payer: BLUE CROSS/BLUE SHIELD | Admitting: Rheumatology

## 2017-04-16 VITALS — BP 116/80 | HR 73 | Resp 14 | Ht 62.0 in | Wt 168.0 lb

## 2017-04-16 DIAGNOSIS — M0579 Rheumatoid arthritis with rheumatoid factor of multiple sites without organ or systems involvement: Secondary | ICD-10-CM

## 2017-04-16 DIAGNOSIS — Z8719 Personal history of other diseases of the digestive system: Secondary | ICD-10-CM

## 2017-04-16 DIAGNOSIS — Z79899 Other long term (current) drug therapy: Secondary | ICD-10-CM | POA: Diagnosis not present

## 2017-04-16 DIAGNOSIS — M47896 Other spondylosis, lumbar region: Secondary | ICD-10-CM

## 2017-04-16 LAB — CBC WITH DIFFERENTIAL/PLATELET
Basophils Absolute: 50 cells/uL (ref 0–200)
Basophils Relative: 1 %
EOS PCT: 4 %
Eosinophils Absolute: 200 cells/uL (ref 15–500)
HCT: 39.3 % (ref 35.0–45.0)
Hemoglobin: 13.2 g/dL (ref 11.7–15.5)
LYMPHS ABS: 1350 {cells}/uL (ref 850–3900)
Lymphocytes Relative: 27 %
MCH: 32.1 pg (ref 27.0–33.0)
MCHC: 33.6 g/dL (ref 32.0–36.0)
MCV: 95.6 fL (ref 80.0–100.0)
MONOS PCT: 11 %
MPV: 10.6 fL (ref 7.5–12.5)
Monocytes Absolute: 550 cells/uL (ref 200–950)
NEUTROS ABS: 2850 {cells}/uL (ref 1500–7800)
Neutrophils Relative %: 57 %
PLATELETS: 233 10*3/uL (ref 140–400)
RBC: 4.11 MIL/uL (ref 3.80–5.10)
RDW: 14.1 % (ref 11.0–15.0)
WBC: 5 10*3/uL (ref 3.8–10.8)

## 2017-04-16 LAB — COMPLETE METABOLIC PANEL WITH GFR
ALBUMIN: 4 g/dL (ref 3.6–5.1)
ALT: 10 U/L (ref 6–29)
AST: 17 U/L (ref 10–30)
Alkaline Phosphatase: 48 U/L (ref 33–115)
BILIRUBIN TOTAL: 0.3 mg/dL (ref 0.2–1.2)
BUN: 18 mg/dL (ref 7–25)
CO2: 26 mmol/L (ref 20–31)
Calcium: 9.2 mg/dL (ref 8.6–10.2)
Chloride: 104 mmol/L (ref 98–110)
Creat: 0.78 mg/dL (ref 0.50–1.10)
GFR, Est African American: >89 mL/min (ref >=60)
GFR, Est Non African American: >89 mL/min (ref >=60)
GLUCOSE: 84 mg/dL (ref 65–99)
POTASSIUM: 4.7 mmol/L (ref 3.5–5.3)
SODIUM: 138 mmol/L (ref 135–146)
TOTAL PROTEIN: 6.8 g/dL (ref 6.1–8.1)

## 2017-04-16 MED ORDER — METHOTREXATE SODIUM CHEMO INJECTION 50 MG/2ML: INTRAMUSCULAR | 0 refills | Status: DC

## 2017-04-16 MED ORDER — DICLOFENAC SODIUM 1 % TD GEL: TRANSDERMAL | 3 refills | Status: DC

## 2017-04-16 NOTE — Patient Instructions (Signed)
  Standing Labs We placed an order today for your standing lab work.    Please come back and get your standing labs in 3 months ( Late July 2018, October 2018)  We have open lab Monday through Friday from 8:30-11:30 AM and 1:30-4 PM at the office of Dr. Janalyn Rouse Deveshwar/Preciliano Castell, PA-C.   The office is located at [address], [place], [place] [address] No appointment is necessary.   Labs are drawn by [facility].  You may receive a bill from [facility] for your lab work.

## 2017-04-16 NOTE — Telephone Encounter (Signed)
Error

## 2017-04-18 DIAGNOSIS — H1859 Other hereditary corneal dystrophies: Secondary | ICD-10-CM | POA: Diagnosis not present

## 2017-04-18 DIAGNOSIS — H524 Presbyopia: Secondary | ICD-10-CM | POA: Diagnosis not present

## 2017-04-22 ENCOUNTER — Telehealth: Payer: Self-pay | Admitting: Radiology

## 2017-04-22 NOTE — Telephone Encounter (Signed)
-----   Message from Tawni Pummel, [place] sent at 04/22/2017  4:42 PM EDT ----- Send copy of labs to PCP And tell patient CBC with differential and CMP with GFR normal

## 2017-04-22 NOTE — Telephone Encounter (Signed)
Routed to PCP I have called patient to advise labs are normal

## 2017-05-08 DIAGNOSIS — E890 Postprocedural hypothyroidism: Secondary | ICD-10-CM | POA: Diagnosis not present

## 2017-05-28 DIAGNOSIS — I10 Essential (primary) hypertension: Secondary | ICD-10-CM | POA: Diagnosis not present

## 2017-05-28 DIAGNOSIS — M069 Rheumatoid arthritis, unspecified: Secondary | ICD-10-CM | POA: Diagnosis not present

## 2017-05-28 DIAGNOSIS — E559 Vitamin D deficiency, unspecified: Secondary | ICD-10-CM | POA: Diagnosis not present

## 2017-05-28 DIAGNOSIS — E890 Postprocedural hypothyroidism: Secondary | ICD-10-CM | POA: Diagnosis not present

## 2017-05-28 DIAGNOSIS — R002 Palpitations: Secondary | ICD-10-CM | POA: Diagnosis not present

## 2017-06-15 DIAGNOSIS — J029 Acute pharyngitis, unspecified: Secondary | ICD-10-CM | POA: Diagnosis not present

## 2017-06-18 ENCOUNTER — Ambulatory Visit: Payer: BLUE CROSS/BLUE SHIELD | Admitting: Rheumatology

## 2017-06-18 ENCOUNTER — Telehealth: Payer: Self-pay | Admitting: Rheumatology

## 2017-06-18 MED ORDER — PREDNISONE 5 MG PO TABS: ORAL_TABLET | ORAL | 0 refills | Status: DC

## 2017-06-18 NOTE — Telephone Encounter (Signed)
Patient advised prescription sent to the pharmacy. Advised of side effects of medications. Patient verbalized understanding.

## 2017-06-18 NOTE — Telephone Encounter (Signed)
Patient states her father in [place] had a heart attack and had to leave town and was unable to make her appointment today. Patient is having a RA flare. Patient states she is having wrists, elbows, ankles, knees and thumbs stiffness and weakness. Patient states she is not currently taking her MTX because she was recently on amoxicillin and had upper respiratory infection. Patient is requesting a prednisone prescription.

## 2017-06-18 NOTE — Telephone Encounter (Signed)
Prednisone 5 mg tablet, 4 tablets for 4 days, 3 for 4 days, 2 for 4 days, 1 for 4 days, half for 4 days. Please notify the medication can cause weight gain, increase in the pressure, increase in blood sugar, risk of osteoporosis.

## 2017-06-18 NOTE — Telephone Encounter (Signed)
Patient is unable to make her appointment with Mr. Leane Call due to a family emergency.  She is wondering if she could get [facility] RA flare up.  CB#[phone].  Thank you.

## 2017-08-20 ENCOUNTER — Other Ambulatory Visit: Payer: Self-pay | Admitting: Radiology

## 2017-08-20 DIAGNOSIS — Z79899 Other long term (current) drug therapy: Secondary | ICD-10-CM

## 2017-08-26 DIAGNOSIS — J340 Abscess, furuncle and carbuncle of nose: Secondary | ICD-10-CM | POA: Diagnosis not present

## 2017-08-30 DIAGNOSIS — J340 Abscess, furuncle and carbuncle of nose: Secondary | ICD-10-CM | POA: Diagnosis not present

## 2017-08-30 DIAGNOSIS — E890 Postprocedural hypothyroidism: Secondary | ICD-10-CM | POA: Diagnosis not present

## 2017-08-30 DIAGNOSIS — M069 Rheumatoid arthritis, unspecified: Secondary | ICD-10-CM | POA: Diagnosis not present

## 2017-08-30 DIAGNOSIS — E559 Vitamin D deficiency, unspecified: Secondary | ICD-10-CM | POA: Diagnosis not present

## 2017-09-10 ENCOUNTER — Encounter (INDEPENDENT_AMBULATORY_CARE_PROVIDER_SITE_OTHER): Payer: Self-pay

## 2017-09-10 ENCOUNTER — Encounter: Payer: Self-pay | Admitting: Rheumatology

## 2017-09-10 ENCOUNTER — Ambulatory Visit (INDEPENDENT_AMBULATORY_CARE_PROVIDER_SITE_OTHER): Payer: BLUE CROSS/BLUE SHIELD | Admitting: Rheumatology

## 2017-09-10 VITALS — BP 120/72 | HR 74 | Resp 14 | Ht 62.0 in | Wt 166.0 lb

## 2017-09-10 DIAGNOSIS — M47816 Spondylosis without myelopathy or radiculopathy, lumbar region: Secondary | ICD-10-CM

## 2017-09-10 DIAGNOSIS — Z79899 Other long term (current) drug therapy: Secondary | ICD-10-CM

## 2017-09-10 DIAGNOSIS — M25571 Pain in right ankle and joints of right foot: Secondary | ICD-10-CM

## 2017-09-10 DIAGNOSIS — M25531 Pain in right wrist: Secondary | ICD-10-CM

## 2017-09-10 DIAGNOSIS — M0579 Rheumatoid arthritis with rheumatoid factor of multiple sites without organ or systems involvement: Secondary | ICD-10-CM | POA: Diagnosis not present

## 2017-09-10 DIAGNOSIS — M25532 Pain in left wrist: Secondary | ICD-10-CM | POA: Diagnosis not present

## 2017-09-10 MED ORDER — FOLIC ACID 1 MG PO TABS: 2.0000 mg | ORAL_TABLET | Freq: Every day | ORAL | 4 refills | Status: DC

## 2017-09-10 MED ORDER — "TUBERCULIN-ALLERGY SYRINGES 27G X 1/2"" 1 ML KIT": 1.0000 | PACK | 4 refills | Status: DC

## 2017-09-10 MED ORDER — METHOTREXATE SODIUM CHEMO INJECTION 50 MG/2ML: INTRAMUSCULAR | 0 refills | Status: DC

## 2017-09-10 NOTE — Progress Notes (Signed)
Office Visit Note  Patient: Tracey Nichols             Date of Birth: Jun 13, 1971           MRN: [id number]             PCP: Vernie Shanks, MD Referring: Vernie Shanks, MD Visit Date: 09/10/2017 Occupation: '@GUAROCC'$ @    Subjective:  Medication Management   History of Present Illness: Tracey Nichols is a 46 y.o. female  Was last seen in our office on 04/16/2017 for rheumatoid arthritis and high risk prescription (methotrexate 0.7 mL's per week and folic acid 2 mg daily). On the last visit, patient reported that she had to stop the medication temporarily (for 3 months-- was on 2 rounds of antibx before URI resolved -- zmax, then augmentin ) due to upper respiratory infection. Before she could restart the medication, she did have a flare that affected her right ankle. The flare included some discomfort but no swelling.   Today, pt reports that she backed off of mtx (stopped taking it ) b/c pt did not want to worsen her liver (was drinking more than 1 glass of wine / day); Pt flared and given prednisone taper. Did well. Restarted mtx, Developed abcess in nose (and held off on mtx again --labor day) and has not restarted since.  Pt ready to restart mtx.    Activities of Daily Living:  Patient reports morning stiffness for 15 minutes.   Patient Denies nocturnal pain.  Difficulty dressing/grooming: Denies Difficulty climbing stairs: Denies Difficulty getting out of chair: Denies Difficulty using hands for taps, buttons, cutlery, and/or writing: Denies   Review of Systems  Constitutional: Negative for fatigue.  HENT: Negative for mouth sores and mouth dryness.   Eyes: Negative for dryness.  Respiratory: Negative for shortness of breath.   Gastrointestinal: Negative for constipation and diarrhea.  Musculoskeletal: Negative for myalgias and myalgias.  Skin: Negative for sensitivity to sunlight.  Psychiatric/Behavioral: Negative for decreased concentration and sleep  disturbance.    PMFS History:  Patient Active Problem List   Diagnosis Date Noted  . Osteoarthritis of lumbar spine 02/13/2017  . History of gastroesophageal reflux (GERD) 02/12/2017  . 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 High risk medication use 11/08/2016  . Dyspnea 10/14/2013    Past Medical History:  Diagnosis Date  . Acute blood loss anemia 04/30/2012  . Anemia    with 1st pregnancy  . 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 GAD (generalized anxiety disorder)   . GERD (gastroesophageal reflux disease) 2003   years ago - no meds now  . Hypertension    since age 38- usually ok when pregnant  . Hyperthyroidism    Had hyperthyroidism in first pregnancy, subsequently treated with radioiodine  . Hypothyroidism    s/p radioiodine treatment for hyperthyroidism  . Hypothyroidism complicating pregnancy 02/26/92  . Migraines   . PP care - s/p C/S 5/7 - twins 04/29/2012  . Rheumatoid arthritis(714.0)   . Seizure ([place])   . Throat pain     Family History  Problem Relation Age of Onset  . Goiter Mother   . Migraines Mother   . Hyperlipidemia Mother   . Hypertension Father        paternal grandmother  . Asthma Brother   . Heart disease Unknown  both sets of grandparents  . Rheum arthritis Sister   . Lung cancer Paternal Grandfather   . Diabetes Paternal Grandmother   . Arthritis Maternal Grandmother    Past Surgical History:  Procedure Laterality Date  . CESAREAN SECTION    . CESAREAN SECTION  04/29/2012   Procedure: CESAREAN SECTION;  Surgeon: Lenoard Aden, MD;  Location: [facility];  Service: Gynecology;  Laterality: N/A;  Repeat Cesarean Section; Twins   Social History   Social History Narrative  . No narrative on file     Objective: Vital Signs: BP 120/72   Pulse 74   Resp 14   Ht 5\' 2"  (1.575 m)   Wt 166 lb (75.3 kg)    BMI 30.36 kg/m    Physical Exam  Constitutional: She is oriented to person, place, and time. She appears well-developed and well-nourished.  HENT:  Head: Normocephalic and atraumatic.  Eyes: Pupils are equal, round, and reactive to light. EOM are normal.  Cardiovascular: Normal rate, regular rhythm and normal heart sounds.  Exam reveals no gallop and no friction rub.   No murmur heard. Pulmonary/Chest: Effort normal and breath sounds normal. She has no wheezes. She has no rales.  Abdominal: Soft. Bowel sounds are normal. She exhibits no distension. There is no tenderness. There is no guarding. No hernia.  Musculoskeletal: Normal range of motion. She exhibits no edema, tenderness or deformity.  Lymphadenopathy:    She has no cervical adenopathy.  Neurological: She is alert and oriented to person, place, and time. Coordination normal.  Skin: Skin is warm and dry. Capillary refill takes less than 2 seconds. No rash noted.  Psychiatric: She has a normal mood and affect. Her behavior is normal.  Nursing note and vitals reviewed.    Musculoskeletal Exam:  Full range of motion of all joints Grip strength is equal and strong bilaterally Fiber myalgia tender points are absent  CDAI Exam: CDAI Homunculus Exam:   Tenderness:  Right hand: 2nd MCP and 3rd MCP Left hand: 2nd MCP and 3rd MCP RLE: tibiofemoral LLE: tibiofemoral Right foot: 1st MTP and 2nd MTP Left foot: 1st MTP and 2nd MTP  Joint Counts:  CDAI Tender Joint count: 6 CDAI Swollen Joint count: 0  Global Assessments:  Patient Global Assessment: 6 Provider Global Assessment: 4  CDAI Calculated Score: 16  No synovitis on exam (but pt does hurt a little).  Investigation: No additional findings. Office Visit on 04/16/2017  Component Date Value Ref Range Status  . WBC 04/16/2017 5.0  3.8 - 10.8 K/uL Final  . RBC 04/16/2017 4.11  3.80 - 5.10 MIL/uL Final  . Hemoglobin 04/16/2017 13.2  11.7 - 15.5 g/dL Final  . HCT  04/18/2017 39.3  35.0 - 45.0 % Final  . MCV 04/16/2017 95.6  80.0 - 100.0 fL Final  . MCH 04/16/2017 32.1  27.0 - 33.0 pg Final  . MCHC 04/16/2017 33.6  32.0 - 36.0 g/dL Final  . RDW 04/18/2017 14.1  11.0 - 15.0 % Final  . Platelets 04/16/2017 233  140 - 400 K/uL Final  . MPV 04/16/2017 10.6  7.5 - 12.5 fL Final  . Neutro Abs 04/16/2017 2850  1,500 - 7,800 cells/uL Final  . Lymphs Abs 04/16/2017 1350  850 - 3,900 cells/uL Final  . Monocytes Absolute 04/16/2017 550  200 - 950 cells/uL Final  . Eosinophils Absolute 04/16/2017 200  15 - 500 cells/uL Final  . Basophils Absolute 04/16/2017 50  0 - 200 cells/uL Final  .  Neutrophils Relative % 04/16/2017 57  % Final  . Lymphocytes Relative 04/16/2017 27  % Final  . Monocytes Relative 04/16/2017 11  % Final  . Eosinophils Relative 04/16/2017 4  % Final  . Basophils Relative 04/16/2017 1  % Final  . Smear Review 04/16/2017 Criteria for review not met   Final  . Sodium 04/16/2017 138  135 - 146 mmol/L Final  . Potassium 04/16/2017 4.7  3.5 - 5.3 mmol/L Final  . Chloride 04/16/2017 104  98 - 110 mmol/L Final  . CO2 04/16/2017 26  20 - 31 mmol/L Final  . Glucose, Bld 04/16/2017 84  65 - 99 mg/dL Final  . BUN 04/16/2017 18  7 - 25 mg/dL Final  . Creat 04/16/2017 0.78  0.50 - 1.10 mg/dL Final  . Total Bilirubin 04/16/2017 0.3  0.2 - 1.2 mg/dL Final  . Alkaline Phosphatase 04/16/2017 48  33 - 115 U/L Final  . AST 04/16/2017 17  10 - 30 U/L Final  . ALT 04/16/2017 10  6 - 29 U/L Final  . Total Protein 04/16/2017 6.8  6.1 - 8.1 g/dL Final  . Albumin 04/16/2017 4.0  3.6 - 5.1 g/dL Final  . Calcium 04/16/2017 9.2  8.6 - 10.2 mg/dL Final  . GFR, Est African American 04/16/2017 >89  >=60 mL/min Final  . GFR, Est Non African American 04/16/2017 >89  >=60 mL/min Final     Imaging: No results found.  Speciality Comments: No specialty comments available.    Procedures:  No procedures performed Allergies: Propylthiouracil; Wellbutrin [bupropion  hcl]; and Sulfa antibiotics   Assessment / Plan:     Visit Diagnoses: Rheumatoid arthritis with rheumatoid factor of multiple sites without organ or systems involvement ([facility]) - Plan: CBC with Differential/Platelet, COMPLETE METABOLIC PANEL WITH GFR  High risk medication use - Plan: CBC with Differential/Platelet, COMPLETE METABOLIC PANEL WITH GFR  Pain in right ankle and joints of right foot  Bilateral wrist pain  Osteoarthritis of lumbar spine, unspecified spinal osteoarthritis complication status    Plan: #1: Rheumatoid arthritis. Last visit, patient had to suspend taking methotrexate for about 3 months until her URI resolved. She restarted her methotrexate at 0.7 mL's per week as prescribed and folic acid 2 mg daily and has done well.  #2: High risk prescription Methotrexate 0.7 mL's per week and folic acid 2 mg daily Patient's last CBC with differential and CMP with GFR was on last visit in April 2018 and she is due for repeat labs today (CBC with differential and CMP with GFR). Also, patient is concerned about drinking wine and using methotrexate. As a result, we will recheck her CBC with differential and CMP with GFR in 6-8 weeks. Patient is also planning on getting the flu shot soon. She will start her methotrexate a week after she gets her flu shot.  #3: History of right ankle pain  #4: History of bilateral wrist pain with no synovitis  #5: Return to clinic in 4 months. allergies Orders: Orders Placed This Encounter  Procedures  . CBC with Differential/Platelet  . COMPLETE METABOLIC PANEL WITH GFR   Meds ordered this encounter  Medications  . methotrexate 50 MG/2ML injection    Sig: 0.63m every week subcutaneious    Dispense:  10 mL    Refill:  0    Please dispense 90 day supply preservatives only    Order Specific Question:   Supervising Provider    Answer:   DBo Merino[[id number]]  . folic acid (  FOLVITE) 1 MG tablet    Sig: Take 2 tablets (2 mg total) by  mouth daily.    Dispense:  180 tablet    Refill:  4    Order Specific Question:   Supervising Provider    Answer:   Bo Merino [[id number]]  . Tuberculin-Allergy Syringes 27G X 1/2" 1 ML KIT    Sig: Inject 1 Syringe into the skin once a week. If syringe size greater than 37m, please call me at my office.    Dispense:  12 each    Refill:  4    Order Specific Question:   Supervising Provider    Answer:   DBo Merino[[phone]   Face-to-face time spent with patient was 30 minutes. 50% of time was spent in counseling and coordination of care.  Follow-Up Instructions: Return in about 4 months (around 01/10/2018) for RA,mtx 05.4/YH folic '2mg'$ /d,wrist and hand discomfort.   SBo Merino MD   I examined and evaluated the patient with NEliezer LoftsPA.She had tenderness in multiple joints on my exam. As her infection has resolved we will start MTX today. The plan of care was discussed as noted above.  SBo Merino MD  Note - This record has been created using [employer], commissioning  Chart creation errors have been sought, but may not always  have been located. Such creation errors do not reflect on  the standard of medical care.

## 2017-09-11 LAB — CBC WITH DIFFERENTIAL/PLATELET
BASOS PCT: 0.7 %
Basophils Absolute: 40 cells/uL (ref 0–200)
EOS PCT: 3.9 %
Eosinophils Absolute: 222 cells/uL (ref 15–500)
HCT: 37.8 % (ref 35.0–45.0)
Hemoglobin: 12.6 g/dL (ref 11.7–15.5)
Lymphs Abs: 1744 cells/uL (ref 850–3900)
MCH: 30.8 pg (ref 27.0–33.0)
MCHC: 33.3 g/dL (ref 32.0–36.0)
MCV: 92.4 fL (ref 80.0–100.0)
MONOS PCT: 11.6 %
MPV: 10.7 fL (ref 7.5–12.5)
NEUTROS PCT: 53.2 %
Neutro Abs: 3032 cells/uL (ref 1500–7800)
PLATELETS: 261 10*3/uL (ref 140–400)
RBC: 4.09 10*6/uL (ref 3.80–5.10)
RDW: 13.1 % (ref 11.0–15.0)
TOTAL LYMPHOCYTE: 30.6 %
WBC: 5.7 10*3/uL (ref 3.8–10.8)
WBCMIX: 661 {cells}/uL (ref 200–950)

## 2017-09-11 LAB — COMPLETE METABOLIC PANEL WITH GFR
AG Ratio: 1.5 (calc) (ref 1.0–2.5)
ALBUMIN MSPROF: 4 g/dL (ref 3.6–5.1)
ALKALINE PHOSPHATASE (APISO): 59 U/L (ref 33–115)
ALT: 8 U/L (ref 6–29)
AST: 14 U/L (ref 10–30)
BILIRUBIN TOTAL: 0.4 mg/dL (ref 0.2–1.2)
BUN: 17 mg/dL (ref 7–25)
CHLORIDE: 102 mmol/L (ref 98–110)
CO2: 28 mmol/L (ref 20–32)
Calcium: 9.1 mg/dL (ref 8.6–10.2)
Creat: 0.84 mg/dL (ref 0.50–1.10)
GFR, Est African American: 98 mL/min/{1.73_m2} (ref >=60)
GFR, Est Non African American: 85 mL/min/{1.73_m2} (ref >=60)
GLUCOSE: 84 mg/dL (ref 65–99)
Globulin: 2.7 g/dL (calc) (ref 1.9–3.7)
POTASSIUM: 4.3 mmol/L (ref 3.5–5.3)
Sodium: 138 mmol/L (ref 135–146)
Total Protein: 6.7 g/dL (ref 6.1–8.1)

## 2017-09-17 ENCOUNTER — Ambulatory Visit: Payer: BLUE CROSS/BLUE SHIELD | Admitting: Rheumatology

## 2017-10-02 DIAGNOSIS — M5414 Radiculopathy, thoracic region: Secondary | ICD-10-CM | POA: Diagnosis not present

## 2017-10-02 DIAGNOSIS — R930 Abnormal findings on diagnostic imaging of skull and head, not elsewhere classified: Secondary | ICD-10-CM | POA: Diagnosis not present

## 2017-10-02 DIAGNOSIS — G518 Other disorders of facial nerve: Secondary | ICD-10-CM | POA: Diagnosis not present

## 2017-10-02 DIAGNOSIS — G523 Disorders of hypoglossal nerve: Secondary | ICD-10-CM | POA: Diagnosis not present

## 2017-10-02 DIAGNOSIS — M5412 Radiculopathy, cervical region: Secondary | ICD-10-CM | POA: Diagnosis not present

## 2017-10-02 DIAGNOSIS — G5603 Carpal tunnel syndrome, bilateral upper limbs: Secondary | ICD-10-CM | POA: Diagnosis not present

## 2017-10-02 DIAGNOSIS — M5481 Occipital neuralgia: Secondary | ICD-10-CM | POA: Diagnosis not present

## 2017-10-15 DIAGNOSIS — Z01419 Encounter for gynecological examination (general) (routine) without abnormal findings: Secondary | ICD-10-CM | POA: Diagnosis not present

## 2017-10-15 DIAGNOSIS — Z1231 Encounter for screening mammogram for malignant neoplasm of breast: Secondary | ICD-10-CM | POA: Diagnosis not present

## 2017-10-15 DIAGNOSIS — E890 Postprocedural hypothyroidism: Secondary | ICD-10-CM | POA: Diagnosis not present

## 2017-10-15 DIAGNOSIS — Z6829 Body mass index (BMI) 29.0-29.9, adult: Secondary | ICD-10-CM | POA: Diagnosis not present

## 2017-10-15 DIAGNOSIS — R87610 Atypical squamous cells of undetermined significance on cytologic smear of cervix (ASC-US): Secondary | ICD-10-CM | POA: Diagnosis not present

## 2017-10-21 DIAGNOSIS — F411 Generalized anxiety disorder: Secondary | ICD-10-CM | POA: Diagnosis not present

## 2017-10-28 DIAGNOSIS — F411 Generalized anxiety disorder: Secondary | ICD-10-CM | POA: Diagnosis not present

## 2017-11-01 DIAGNOSIS — F4323 Adjustment disorder with mixed anxiety and depressed mood: Secondary | ICD-10-CM | POA: Diagnosis not present

## 2017-11-20 DIAGNOSIS — J019 Acute sinusitis, unspecified: Secondary | ICD-10-CM | POA: Diagnosis not present

## 2017-11-20 DIAGNOSIS — J40 Bronchitis, not specified as acute or chronic: Secondary | ICD-10-CM | POA: Diagnosis not present

## 2017-12-02 IMAGING — US US ABDOMEN LIMITED
1 series · 14 of 25 positions shown · non-contrast
Comparison: Ultrasound 02/12/2012 .

CLINICAL DATA: Right upper quadrant pain.

EXAM:
US ABDOMEN LIMITED - RIGHT UPPER QUADRANT

[Series 1: us abdomen limited · 0.20mm/px · 14 of 49 slices shown]
[im 1/49]
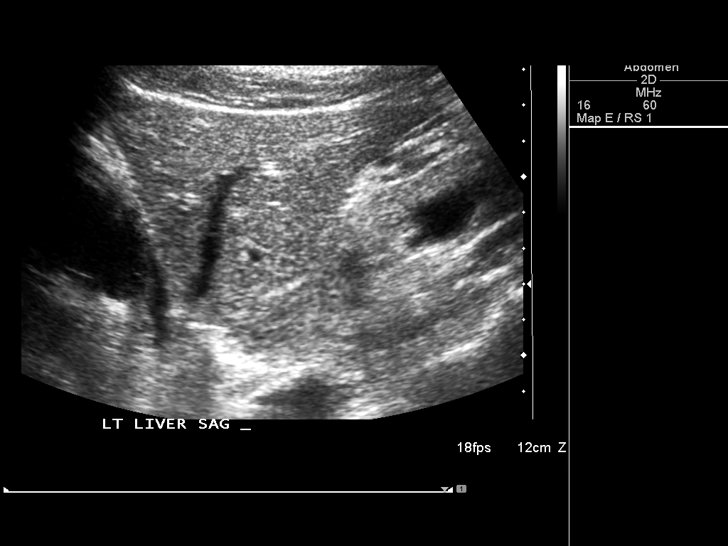
[im 5/49]
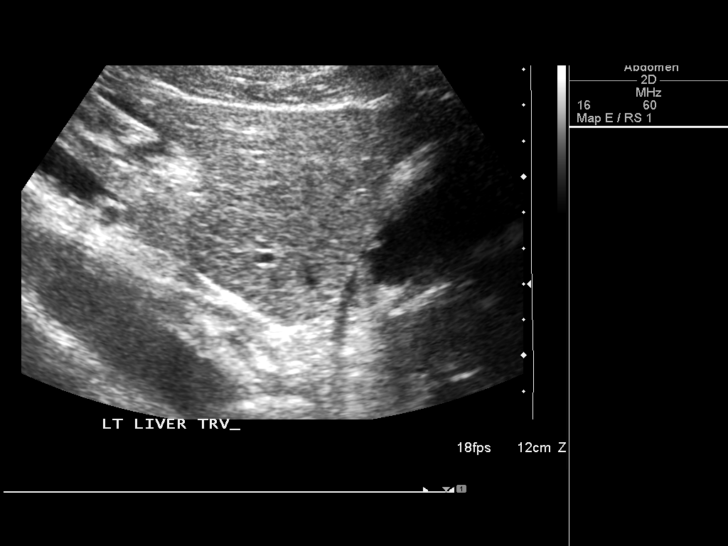
[im 9/49]
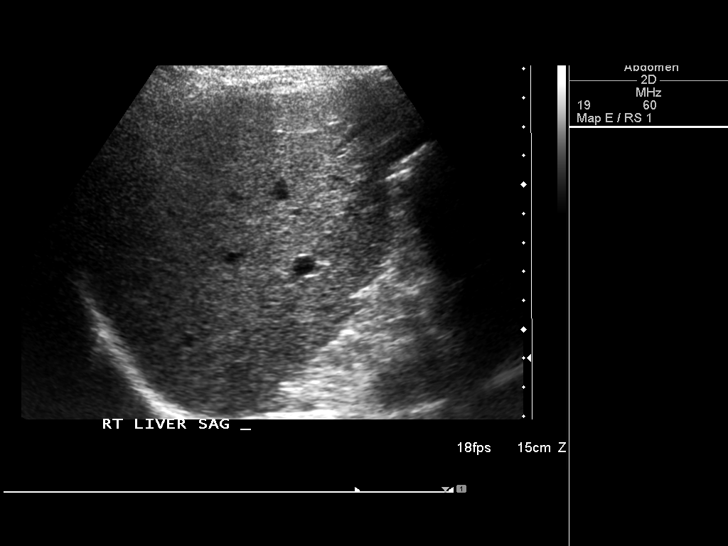
[im 13/49]
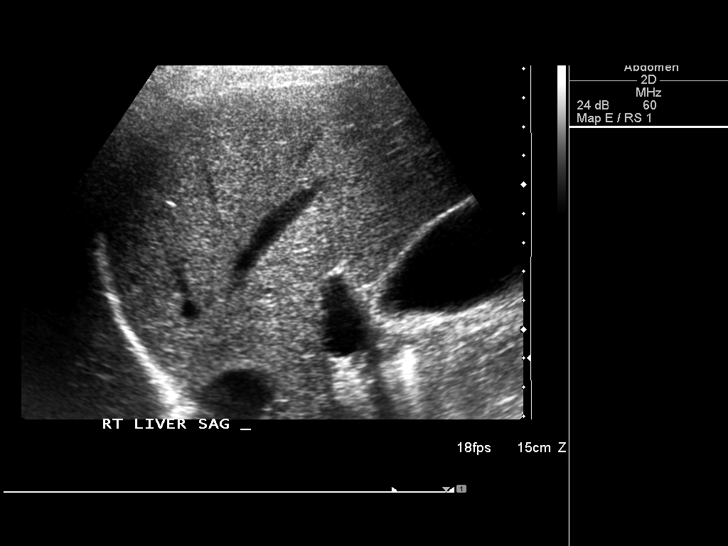
[im 17/49]
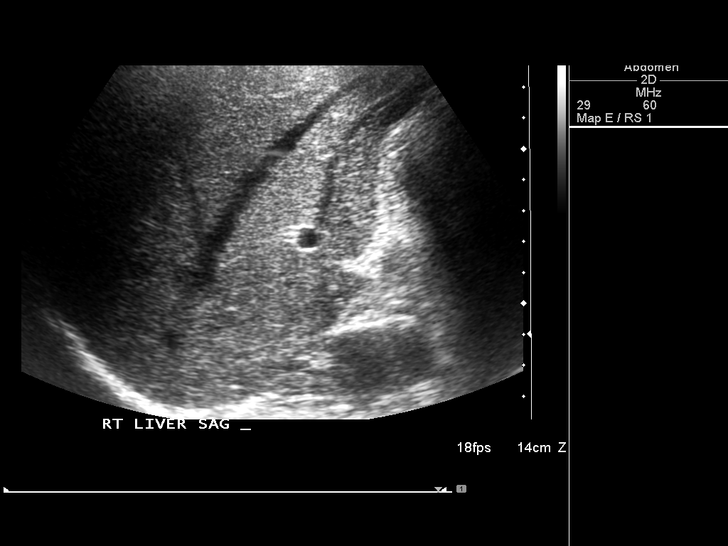
[im 19/49]
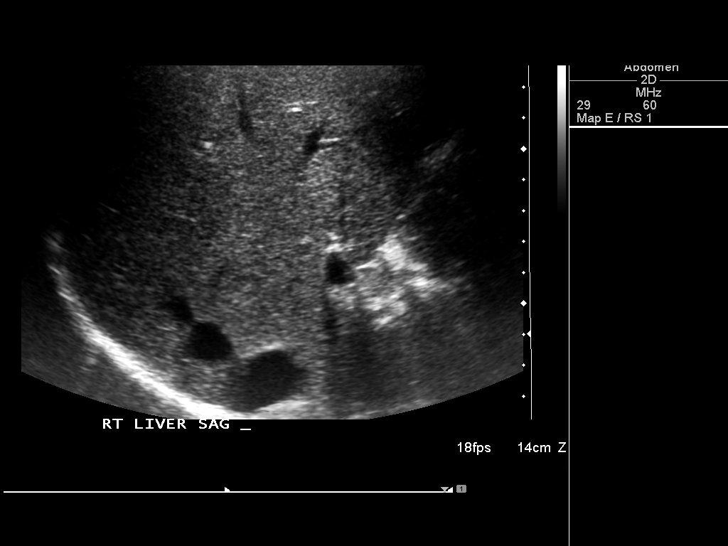
[im 23/49]
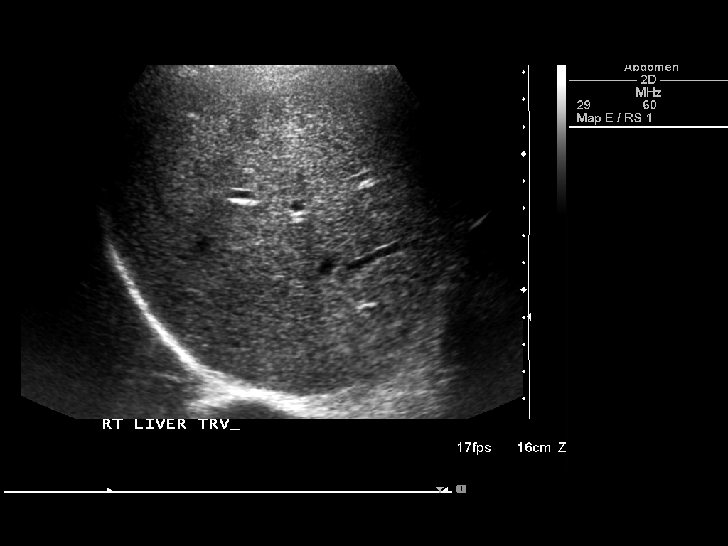
[im 27/49]
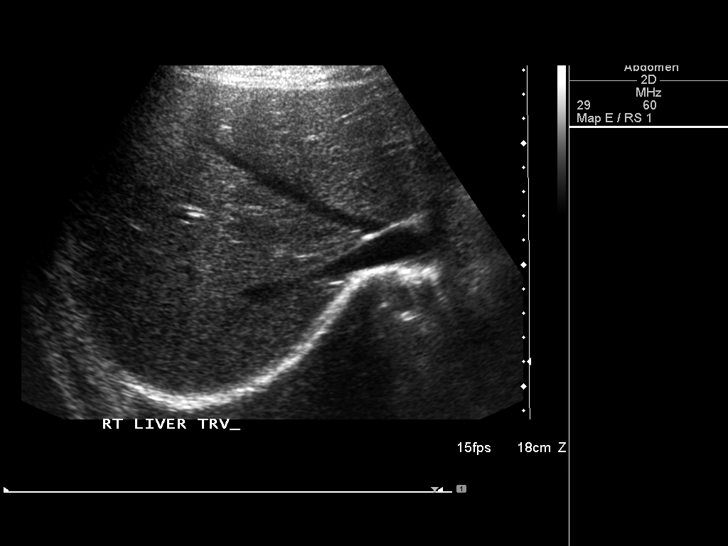
[im 31/49]
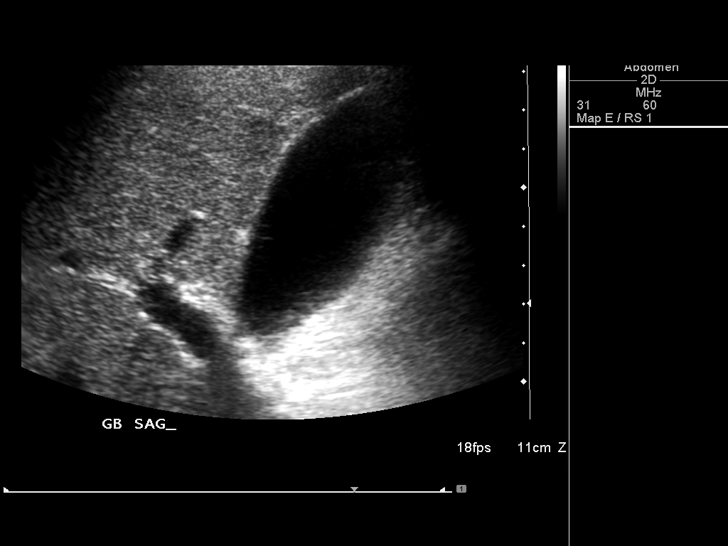
[im 33/49]
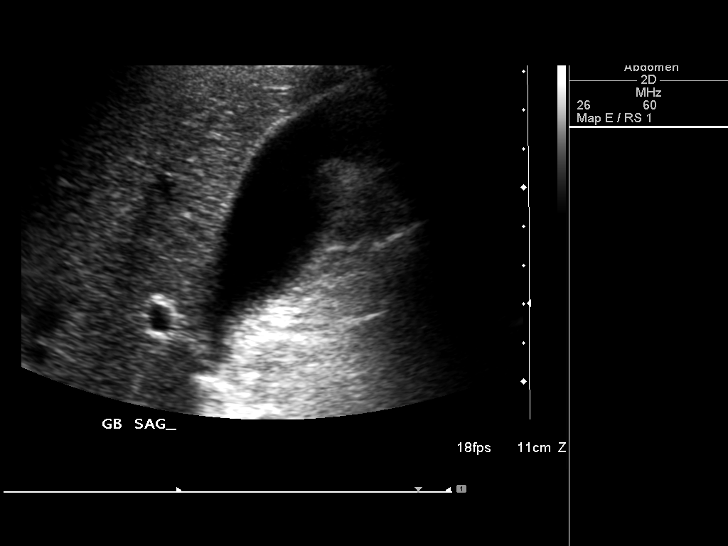
[im 37/49]
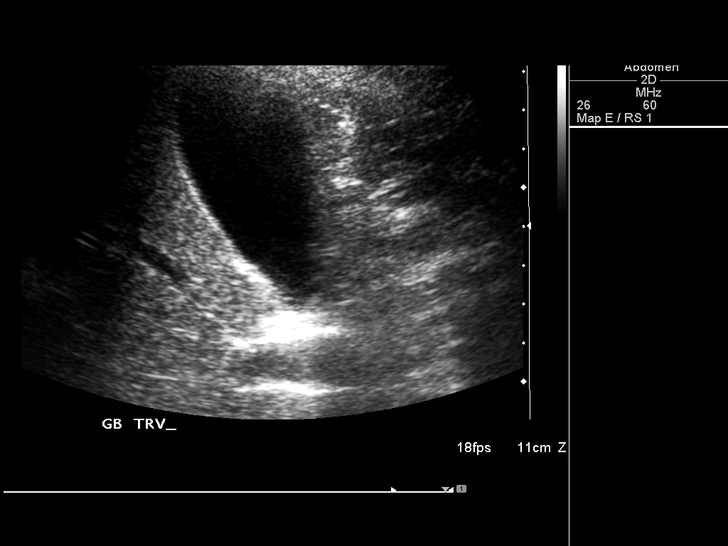
[im 41/49]
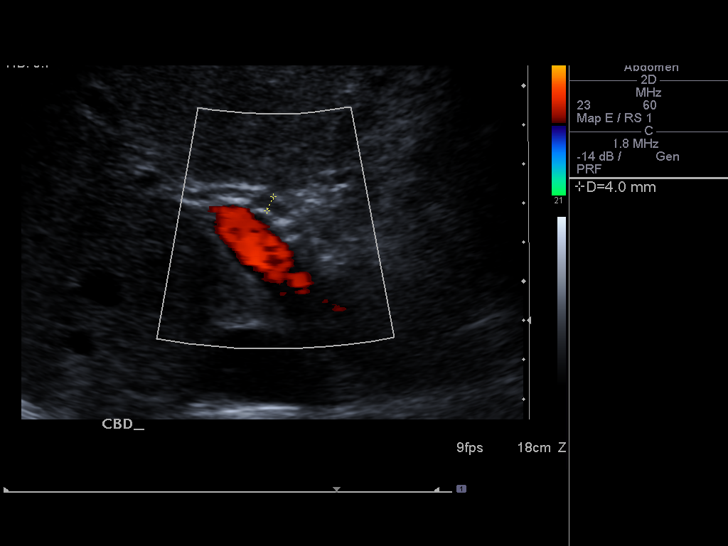
[im 45/49]
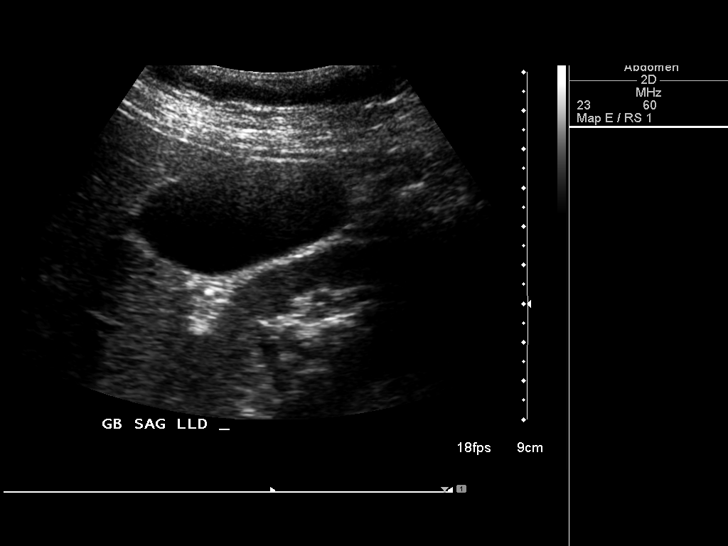
[im 49/49]
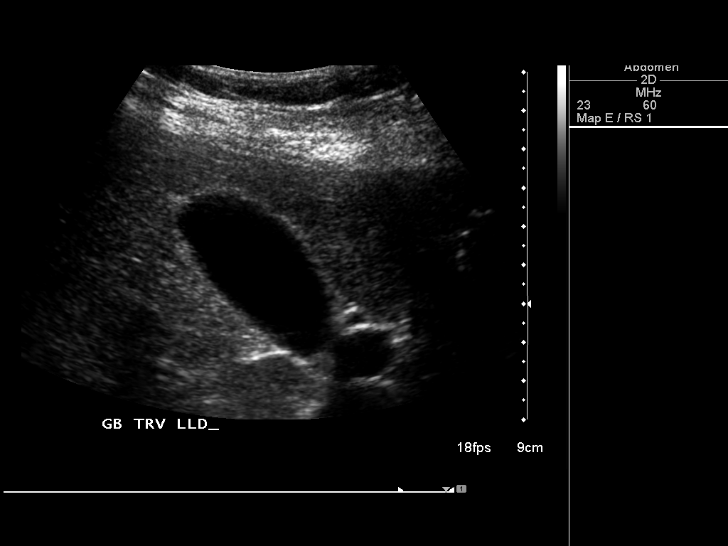

[14 of 25 positions shown; findings below may reference images not displayed]

FINDINGS: Gallbladder:

No gallstones or wall thickening visualized. No sonographic Murphy
sign noted by sonographer.

Common bile duct:

Diameter: 4.0 mm

Liver:

No focal lesion identified. Within normal limits in parenchymal
echogenicity.
IMPRESSION: No acute or focal abnormality

## 2017-12-11 DIAGNOSIS — F411 Generalized anxiety disorder: Secondary | ICD-10-CM | POA: Diagnosis not present

## 2017-12-19 DIAGNOSIS — F4323 Adjustment disorder with mixed anxiety and depressed mood: Secondary | ICD-10-CM | POA: Diagnosis not present

## 2017-12-19 DIAGNOSIS — Z23 Encounter for immunization: Secondary | ICD-10-CM | POA: Diagnosis not present

## 2018-01-06 DIAGNOSIS — F411 Generalized anxiety disorder: Secondary | ICD-10-CM | POA: Diagnosis not present

## 2018-01-14 DIAGNOSIS — F411 Generalized anxiety disorder: Secondary | ICD-10-CM | POA: Diagnosis not present

## 2018-02-12 ENCOUNTER — Other Ambulatory Visit: Payer: Self-pay

## 2018-02-12 ENCOUNTER — Other Ambulatory Visit: Payer: Self-pay | Admitting: *Deleted

## 2018-02-12 DIAGNOSIS — Z79899 Other long term (current) drug therapy: Secondary | ICD-10-CM

## 2018-02-13 DIAGNOSIS — Z79899 Other long term (current) drug therapy: Secondary | ICD-10-CM | POA: Diagnosis not present

## 2018-02-14 ENCOUNTER — Telehealth: Payer: Self-pay | Admitting: *Deleted

## 2018-02-14 LAB — COMPLETE METABOLIC PANEL WITH GFR
AG Ratio: 1.5 (calc) (ref 1.0–2.5)
ALBUMIN MSPROF: 4.1 g/dL (ref 3.6–5.1)
ALKALINE PHOSPHATASE (APISO): 57 U/L (ref 33–115)
ALT: 11 U/L (ref 6–29)
AST: 19 U/L (ref 10–35)
BILIRUBIN TOTAL: 0.2 mg/dL (ref 0.2–1.2)
BUN: 16 mg/dL (ref 7–25)
CHLORIDE: 106 mmol/L (ref 98–110)
CO2: 26 mmol/L (ref 20–32)
CREATININE: 0.82 mg/dL (ref 0.50–1.10)
Calcium: 9.1 mg/dL (ref 8.6–10.2)
GFR, Est African American: 100 mL/min/{1.73_m2} (ref >=60)
GFR, Est Non African American: 86 mL/min/{1.73_m2} (ref >=60)
GLOBULIN: 2.7 g/dL (ref 1.9–3.7)
Glucose, Bld: 79 mg/dL (ref 65–99)
POTASSIUM: 4.7 mmol/L (ref 3.5–5.3)
SODIUM: 136 mmol/L (ref 135–146)
Total Protein: 6.8 g/dL (ref 6.1–8.1)

## 2018-02-14 LAB — CBC WITH DIFFERENTIAL/PLATELET
BASOS PCT: 0.7 %
Basophils Absolute: 53 cells/uL (ref 0–200)
EOS PCT: 2.9 %
Eosinophils Absolute: 218 cells/uL (ref 15–500)
HCT: 39.7 % (ref 35.0–45.0)
Hemoglobin: 13.5 g/dL (ref 11.7–15.5)
Lymphs Abs: 1695 cells/uL (ref 850–3900)
MCH: 30.6 pg (ref 27.0–33.0)
MCHC: 34 g/dL (ref 32.0–36.0)
MCV: 90 fL (ref 80.0–100.0)
MONOS PCT: 9.5 %
MPV: 10.8 fL (ref 7.5–12.5)
NEUTROS PCT: 64.3 %
Neutro Abs: 4823 cells/uL (ref 1500–7800)
Platelets: 276 10*3/uL (ref 140–400)
RBC: 4.41 10*6/uL (ref 3.80–5.10)
RDW: 13 % (ref 11.0–15.0)
Total Lymphocyte: 22.6 %
WBC mixed population: 713 cells/uL (ref 200–950)
WBC: 7.5 10*3/uL (ref 3.8–10.8)

## 2018-02-14 MED ORDER — PREDNISONE 5 MG PO TABS: ORAL_TABLET | ORAL | 0 refills | Status: DC

## 2018-02-14 NOTE — Telephone Encounter (Signed)
Left message to advise patient prescription for prednisone has been sent to the pharmacy.  

## 2018-02-14 NOTE — Telephone Encounter (Signed)
Called patient with lab results. Patient states she has been flaring for several weeks as she had to hold her MTX due to an upper respiratory infection. Patient states she is having pain and swelling in her ankles and feet as well as her wrists and hands. Patient is requesting a prescription for Prednisone. Please advise.

## 2018-02-14 NOTE — Telephone Encounter (Signed)
ok give prednisone taper starting at 20 mg and taper by 5 mg every 4 days

## 2018-02-20 DIAGNOSIS — E890 Postprocedural hypothyroidism: Secondary | ICD-10-CM | POA: Diagnosis not present

## 2018-02-21 DIAGNOSIS — E890 Postprocedural hypothyroidism: Secondary | ICD-10-CM | POA: Diagnosis not present

## 2018-03-06 DIAGNOSIS — J069 Acute upper respiratory infection, unspecified: Secondary | ICD-10-CM | POA: Diagnosis not present

## 2018-03-06 DIAGNOSIS — J209 Acute bronchitis, unspecified: Secondary | ICD-10-CM | POA: Diagnosis not present

## 2018-03-28 ENCOUNTER — Telehealth: Payer: Self-pay | Admitting: Rheumatology

## 2018-03-28 NOTE — Telephone Encounter (Signed)
Patient called requesting prescription refill of Methotrexate.  Patient states that she would like to switch from the injection to the pills due to having a difficult time giving herself the injections.    [facility] [place]

## 2018-03-31 NOTE — Telephone Encounter (Signed)
She is currently on methotrexate 0.7 mL subcu weekly.  Okay to switch to methotrexate 2.5 mg, 7 tablets p.o. weekly.

## 2018-03-31 NOTE — Telephone Encounter (Signed)
Patient states she is having a hard time injecting herself and would like to switch back to tablets for right now. Patient states she may try the injections at a later date but right now she cannot do it. Please advise.

## 2018-03-31 NOTE — Telephone Encounter (Signed)
Attempted to call patient and left message on machine to advise patient to call the office.  

## 2018-04-01 ENCOUNTER — Telehealth: Payer: Self-pay | Admitting: Rheumatology

## 2018-04-01 MED ORDER — METHOTREXATE 2.5 MG PO TABS: 17.5000 mg | ORAL_TABLET | ORAL | 0 refills | Status: DC

## 2018-04-01 NOTE — Telephone Encounter (Signed)
Patient advised Dr. Corliss Skains is okay with switching her back to tablets. Prescription has been sent to the pharmacy.

## 2018-04-01 NOTE — Telephone Encounter (Signed)
Patient called stating that she was returning Tracey Nichols's call from yesterday.

## 2018-04-10 DIAGNOSIS — E890 Postprocedural hypothyroidism: Secondary | ICD-10-CM | POA: Diagnosis not present

## 2018-04-22 DIAGNOSIS — M069 Rheumatoid arthritis, unspecified: Secondary | ICD-10-CM | POA: Diagnosis not present

## 2018-04-22 DIAGNOSIS — E890 Postprocedural hypothyroidism: Secondary | ICD-10-CM | POA: Diagnosis not present

## 2018-04-22 DIAGNOSIS — E559 Vitamin D deficiency, unspecified: Secondary | ICD-10-CM | POA: Diagnosis not present

## 2018-05-02 ENCOUNTER — Telehealth: Payer: Self-pay | Admitting: Rheumatology

## 2018-05-02 ENCOUNTER — Ambulatory Visit: Payer: BLUE CROSS/BLUE SHIELD | Admitting: Rheumatology

## 2018-05-02 ENCOUNTER — Encounter: Payer: Self-pay | Admitting: Physician Assistant

## 2018-05-02 VITALS — BP 140/88 | HR 75 | Resp 13 | Ht 62.0 in | Wt 166.0 lb

## 2018-05-02 DIAGNOSIS — Z8719 Personal history of other diseases of the digestive system: Secondary | ICD-10-CM

## 2018-05-02 DIAGNOSIS — M47816 Spondylosis without myelopathy or radiculopathy, lumbar region: Secondary | ICD-10-CM

## 2018-05-02 DIAGNOSIS — M0579 Rheumatoid arthritis with rheumatoid factor of multiple sites without organ or systems involvement: Secondary | ICD-10-CM | POA: Diagnosis not present

## 2018-05-02 DIAGNOSIS — Z79899 Other long term (current) drug therapy: Secondary | ICD-10-CM | POA: Diagnosis not present

## 2018-05-02 MED ORDER — PREDNISONE 5 MG PO TABS: ORAL_TABLET | ORAL | 0 refills | Status: DC

## 2018-05-02 NOTE — Telephone Encounter (Signed)
Please schedule patient with Ladona Ridgel 05/02/18

## 2018-05-02 NOTE — Telephone Encounter (Signed)
I spoke with patient, and she is unable to come today. Patient scheduled appt for Monday.

## 2018-05-02 NOTE — Progress Notes (Signed)
Office Visit Note  Patient: Tracey Nichols             Date of Birth: [birth year]           MRN: [id number]             PCP: Ileana Ladd, MD Referring: Ileana Ladd, MD Visit Date: 05/02/2018 Occupation: @GUAROCC @    Subjective:  Pain in bilateral hands and bilateral feet   History of Present Illness: Tracey Nichols is a 47 y.o. female with history of seropositive rheumatoid arthritis.  Patient states that a few weeks ago she switch from the cutaneous methotrexate to the p.o. version of methotrexate.  She states that over the past few months she has been having increased flares.  She states that right now she is having pain in her right third PIP joint as well as her bilateral wrists and bilateral feet.  She states that she is also having increased joint stiffness in multiple joints first thing in the morning.  She states she is also noticing some swelling in her bilateral ankle joints.  She states that in the past she has tried Humira as well as Plaquenil while she was pregnant.  She denies any pain in any other joints.  She states that she has a stiffness in her lower back first thing in the morning.   Activities of Daily Living:  Patient reports morning stiffness for 1-2  hours.   Patient Denies nocturnal pain.  Difficulty dressing/grooming: Denies Difficulty climbing stairs: Reports Difficulty getting out of chair: Denies Difficulty using hands for taps, buttons, cutlery, and/or writing: Denies   Review of Systems  Constitutional: Positive for fatigue.  HENT: Negative for mouth sores, trouble swallowing, trouble swallowing, mouth dryness and nose dryness.   Eyes: Negative for pain, visual disturbance and dryness.  Respiratory: Negative for cough, hemoptysis, shortness of breath and difficulty breathing.   Cardiovascular: Negative for chest pain, palpitations, hypertension and swelling in legs/feet.  Gastrointestinal: Negative for blood in stool, constipation and diarrhea.   Endocrine: Negative for increased urination.  Genitourinary: Negative for painful urination.  Musculoskeletal: Positive for arthralgias, joint pain, joint swelling and morning stiffness. Negative for myalgias, muscle weakness, muscle tenderness and myalgias.  Skin: Negative for color change, pallor, rash, hair loss, nodules/bumps, skin tightness, ulcers and sensitivity to sunlight.  Allergic/Immunologic: Negative for susceptible to infections.  Neurological: Negative for dizziness, numbness, headaches and weakness.  Hematological: Negative for swollen glands.  Psychiatric/Behavioral: Negative for depressed mood and sleep disturbance. The patient is nervous/anxious.     PMFS History:  Patient Active Problem List   Diagnosis Date Noted  . Osteoarthritis of lumbar spine 02/13/2017  . History of gastroesophageal reflux (GERD) 02/12/2017  . Rheumatoid arthritis with rheumatoid factor of multiple sites without organ or systems involvement (HCC) 11/08/2016  . High risk medication use 11/08/2016  . Dyspnea 10/14/2013    Past Medical History:  Diagnosis Date  . Acute blood loss anemia 04/30/2012  . Anemia    with 1st pregnancy  . 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 GAD (generalized anxiety disorder)   . GERD (gastroesophageal reflux disease) 2003   years ago - no meds now  . Hypertension    since age 82- usually ok when pregnant  . Hyperthyroidism    Had hyperthyroidism in first pregnancy, subsequently treated with radioiodine  . Hypothyroidism    s/p radioiodine treatment for hyperthyroidism  .  Hypothyroidism complicating pregnancy 04/30/2012  . Migraines   . PP care - s/p C/S 5/7 - twins 04/29/2012  . Rheumatoid arthritis(714.0)   . Seizure (HCC)   . Throat pain     Family History  Problem Relation Age of Onset  . Goiter Mother   . Migraines Mother   . Hyperlipidemia Mother   . Hypertension Father        paternal  grandmother  . Asthma Brother   . Heart disease Unknown        both sets of grandparents  . Rheum arthritis Sister   . Lung cancer Paternal Grandfather   . Diabetes Paternal Grandmother   . Arthritis Maternal Grandmother   . Healthy Son   . Healthy Daughter   . Healthy Daughter    Past Surgical History:  Procedure Laterality Date  . CESAREAN SECTION    . CESAREAN SECTION  04/29/2012   Procedure: CESAREAN SECTION;  Surgeon: Lenoard Aden, MD;  Location: [facility];  Service: Gynecology;  Laterality: N/A;  Repeat Cesarean Section; Twins   Social History   Social History Narrative  . Not on file     Objective: Vital Signs: BP 140/88 (BP Location: Left Arm, Patient Position: Sitting, Cuff Size: Normal)   Pulse 75   Resp 13   Ht 5\' 2"  (1.575 m)   Wt 166 lb (75.3 kg)   BMI 30.36 kg/m    Physical Exam  Constitutional: She is oriented to person, place, and time. She appears well-developed and well-nourished.  HENT:  Head: Normocephalic and atraumatic.  Eyes: Conjunctivae and EOM are normal.  Neck: Normal range of motion.  Cardiovascular: Normal rate, regular rhythm, normal heart sounds and intact distal pulses.  Pulmonary/Chest: Effort normal and breath sounds normal.  Abdominal: Soft. Bowel sounds are normal.  Lymphadenopathy:    She has no cervical adenopathy.  Neurological: She is alert and oriented to person, place, and time.  Skin: Skin is warm and dry. Capillary refill takes less than 2 seconds.  Psychiatric: She has a normal mood and affect. Her behavior is normal.  Nursing note and vitals reviewed.    Musculoskeletal Exam: C-spine, thoracic spine, lumbar spine good range of motion.  No midline spinal tenderness.  No SI joint tenderness.  Shoulder joints, elbow joints, wrist joints, MCPs, PIPs, DIPs good range of motion.  She has synovitis of her right third PIP joint.  She has tenderness of bilateral wrists.  Hip joints, knee joints, ankle joints, MTPs, PIPs, DIPs good  range of motion with no synovitis.  No warmth or effusion of bilateral knees.  She has tenderness along the joint line of bilateral ankles.  She has inflammation of bilateral ankle joints.  She has tenderness of bilateral trochanteric bursitis.   CDAI Exam: CDAI Homunculus Exam:   Tenderness:  RUE: wrist LUE: wrist Right hand: 3rd PIP  Swelling:  Right hand: 3rd PIP  Joint Counts:  CDAI Tender Joint count: 3 CDAI Swollen Joint count: 1  Global Assessments:  Patient Global Assessment: 5 Provider Global Assessment: 5  CDAI Calculated Score: 14    Investigation: No additional findings. CBC Latest Ref Rng & Units 02/13/2018 09/10/2017 04/16/2017  WBC 3.8 - 10.8 Thousand/uL 7.5 5.7 5.0  Hemoglobin 11.7 - 15.5 g/dL 04/18/2017 16.1 09.6  Hematocrit 35.0 - 45.0 % 39.7 37.8 39.3  Platelets 140 - 400 Thousand/uL 276 261 233   CMP Latest Ref Rng & Units 02/13/2018 09/10/2017 04/16/2017  Glucose 65 - 99 mg/dL 79  84 84  BUN 7 - 25 mg/dL 16 17 18   Creatinine 0.50 - 1.10 mg/dL 6.46 8.03 2.12  Sodium 135 - 146 mmol/L 136 138 138  Potassium 3.5 - 5.3 mmol/L 4.7 4.3 4.7  Chloride 98 - 110 mmol/L 106 102 104  CO2 20 - 32 mmol/L 26 28 26   Calcium 8.6 - 10.2 mg/dL 9.1 9.1 9.2  Total Protein 6.1 - 8.1 g/dL 6.8 6.7 6.8  Total Bilirubin 0.2 - 1.2 mg/dL 0.2 0.4 0.3  Alkaline Phos 33 - 115 U/L - - 48  AST 10 - 35 U/L 19 14 17   ALT 6 - 29 U/L 11 8 10     Imaging: No results found.  Speciality Comments: No specialty comments available.    Procedures:  No procedures performed Allergies: Propylthiouracil; Wellbutrin [bupropion hcl]; and Sulfa antibiotics   Assessment / Plan:     Visit Diagnoses: Rheumatoid arthritis with rheumatoid factor of multiple sites without organ or systems involvement [facility]): She is active synovitis in her right third PIP joint as well as tenderness of bilateral wrists.  She also has inflammation of bilateral ankles with tenderness along the joint line.  She has been taking  methotrexate 7 tablets by mouth once weekly and folic acid 2 mg daily.  She has recently switched from the vial and syringe methotrexate to the tablet form due to having difficulty drawing up the medication herself.  We will switch her to Rasuvo.  I discussed how to use the injectable pen today in the office.  Also demonstrated how to use it.  A prescription will be sent to the pharmacy.  We also discussed Orencia if she fails to improve with the injectable form of methotrexate.  She was given a brochure of information about Orencia.  Due to her active synovitis a prednisone taper starting at 20 mg tapering by 5 mg every 2 days was sent to the pharmacy.  Potential side effects were discussed.  High risk medication use - Methotrexate 0.7 mL's per week and folic acid 2 mg daily-CBC and CMP will be drawn today to monitor for drug toxicity.  She will return in August and every 3 months for CBC and CMP.- Plan: CBC with Differential/Platelet, COMPLETE METABOLIC PANEL WITH GFR  Osteoarthritis of lumbar spine, unspecified spinal osteoarthritis complication status: No midline spinal tenderness.  No symptoms of radiculopathy.  She has no discomfort at this time.  She has stiffness in her low back first thing in the morning.  History of gastroesophageal reflux (GERD)    Orders: Orders Placed This Encounter  Procedures  . CBC with Differential/Platelet  . COMPLETE METABOLIC PANEL WITH GFR   Meds ordered this encounter  Medications  . predniSONE (DELTASONE) 5 MG tablet    Sig: Take 4 tablets by mouth for 2 days, take 3 tablets for 2 days, 2 tablets for 2 days, 1 tablet for 2 days.    Dispense:  20 tablet    Refill:  0    Face-to-face time spent with patient was 30 minutes. >50% of time was spent in counseling and coordination of care.  Follow-Up Instructions: Return in about 3 months (around 08/02/2018) for Rheumatoid arthritis, Osteoarthritis.   Gearldine Bienenstock, PA-C   I examined and evaluated the  patient with Sherron Ales PA.  She notes synovitis in her right third PIP on my examination.  She was doing better when she was taking subcu methotrexate.  We discussed the option of switching back to subcu methotrexate.  She was in agreement.  She was also treated with Humira in the past and had good response.  We discussed the option of different Biologics.  She will be inclined more towards Orencia subcu in case methotrexate to subcu fails.  The plan of care was discussed as noted above.  Pollyann Savoy, MD  Note - This record has been created using Animal nutritionist.  Chart creation errors have been sought, but may not always  have been located. Such creation errors do not reflect on  the standard of medical care.

## 2018-05-02 NOTE — Telephone Encounter (Signed)
Patient called stating that her right hand middle finger is swollen and painful, and her feet hurt with walking   Patient is currently on Methotrexate which isn't helping with this flair-up.  Patient is requesting a prescription of steroids.  Patient's pharmacy is [facility] in [place].

## 2018-05-03 LAB — CBC WITH DIFFERENTIAL/PLATELET
BASOS ABS: 48 {cells}/uL (ref 0–200)
BASOS PCT: 0.8 %
EOS ABS: 180 {cells}/uL (ref 15–500)
EOS PCT: 3 %
HCT: 39.3 % (ref 35.0–45.0)
HEMOGLOBIN: 13.3 g/dL (ref 11.7–15.5)
Lymphs Abs: 1554 cells/uL (ref 850–3900)
MCH: 30.9 pg (ref 27.0–33.0)
MCHC: 33.8 g/dL (ref 32.0–36.0)
MCV: 91.2 fL (ref 80.0–100.0)
MONOS PCT: 8.1 %
MPV: 10.7 fL (ref 7.5–12.5)
Neutro Abs: 3732 cells/uL (ref 1500–7800)
Neutrophils Relative %: 62.2 %
Platelets: 286 10*3/uL (ref 140–400)
RBC: 4.31 10*6/uL (ref 3.80–5.10)
RDW: 14.2 % (ref 11.0–15.0)
Total Lymphocyte: 25.9 %
WBC mixed population: 486 cells/uL (ref 200–950)
WBC: 6 10*3/uL (ref 3.8–10.8)

## 2018-05-03 LAB — COMPLETE METABOLIC PANEL WITH GFR
AG Ratio: 1.7 (calc) (ref 1.0–2.5)
ALBUMIN MSPROF: 4.3 g/dL (ref 3.6–5.1)
ALT: 9 U/L (ref 6–29)
AST: 17 U/L (ref 10–35)
Alkaline phosphatase (APISO): 61 U/L (ref 33–115)
BUN: 13 mg/dL (ref 7–25)
CALCIUM: 9.6 mg/dL (ref 8.6–10.2)
CO2: 30 mmol/L (ref 20–32)
CREATININE: 0.78 mg/dL (ref 0.50–1.10)
Chloride: 102 mmol/L (ref 98–110)
GFR, EST AFRICAN AMERICAN: 106 mL/min/{1.73_m2} (ref >=60)
GFR, Est Non African American: 92 mL/min/{1.73_m2} (ref >=60)
GLUCOSE: 84 mg/dL (ref 65–99)
Globulin: 2.6 g/dL (calc) (ref 1.9–3.7)
Potassium: 4.6 mmol/L (ref 3.5–5.3)
Sodium: 139 mmol/L (ref 135–146)
TOTAL PROTEIN: 6.9 g/dL (ref 6.1–8.1)
Total Bilirubin: 0.4 mg/dL (ref 0.2–1.2)

## 2018-05-05 ENCOUNTER — Ambulatory Visit: Payer: BLUE CROSS/BLUE SHIELD | Admitting: Physician Assistant

## 2018-05-05 NOTE — Progress Notes (Signed)
Labs are WNL.

## 2018-05-06 ENCOUNTER — Other Ambulatory Visit: Payer: Self-pay | Admitting: *Deleted

## 2018-05-06 ENCOUNTER — Telehealth: Payer: Self-pay | Admitting: Rheumatology

## 2018-05-06 MED ORDER — METHOTREXATE (PF) 17.5 MG/0.35ML ~~LOC~~ SOAJ: 17.5000 mg | SUBCUTANEOUS | 0 refills | Status: DC

## 2018-05-06 NOTE — Telephone Encounter (Signed)
Last Visit: 05/02/18 Next Visit: 08/05/18 Labs: 05/02/18 WNL  Left message to advise patient Prescription has been sent to the specailty pharmacy.

## 2018-05-06 NOTE — Telephone Encounter (Signed)
RX for MTX was sent to [facility], but should have been sent to [facility] in [place]. Please sent to correct pharmacy. Realo to disregard rx.

## 2018-05-26 DIAGNOSIS — R509 Fever, unspecified: Secondary | ICD-10-CM | POA: Diagnosis not present

## 2018-05-26 DIAGNOSIS — J209 Acute bronchitis, unspecified: Secondary | ICD-10-CM | POA: Diagnosis not present

## 2018-05-26 DIAGNOSIS — J101 Influenza due to other identified influenza virus with other respiratory manifestations: Secondary | ICD-10-CM | POA: Diagnosis not present

## 2018-05-29 DIAGNOSIS — J101 Influenza due to other identified influenza virus with other respiratory manifestations: Secondary | ICD-10-CM | POA: Diagnosis not present

## 2018-05-29 DIAGNOSIS — R509 Fever, unspecified: Secondary | ICD-10-CM | POA: Diagnosis not present

## 2018-05-29 DIAGNOSIS — J209 Acute bronchitis, unspecified: Secondary | ICD-10-CM | POA: Diagnosis not present

## 2018-06-21 ENCOUNTER — Other Ambulatory Visit: Payer: Self-pay | Admitting: Rheumatology

## 2018-07-20 ENCOUNTER — Other Ambulatory Visit: Payer: Self-pay | Admitting: Rheumatology

## 2018-07-22 DIAGNOSIS — D239 Other benign neoplasm of skin, unspecified: Secondary | ICD-10-CM | POA: Diagnosis not present

## 2018-07-22 DIAGNOSIS — D224 Melanocytic nevi of scalp and neck: Secondary | ICD-10-CM | POA: Diagnosis not present

## 2018-07-22 DIAGNOSIS — E890 Postprocedural hypothyroidism: Secondary | ICD-10-CM | POA: Diagnosis not present

## 2018-07-22 DIAGNOSIS — L905 Scar conditions and fibrosis of skin: Secondary | ICD-10-CM | POA: Diagnosis not present

## 2018-07-22 NOTE — Progress Notes (Deleted)
Office Visit Note  Patient: Tracey Nichols             Date of Birth: 1971/11/02           MRN: [id number]             PCP: Ileana Ladd, MD Referring: Ileana Ladd, MD Visit Date: 08/05/2018 Occupation: @GUAROCC @  Subjective:  No chief complaint on file.   History of Present Illness: Tracey Nichols is a 47 y.o. female ***   Activities of Daily Living:  Patient reports morning stiffness for *** {minute/hour:19697}.   Patient {ACTIONS;DENIES/REPORTS:21021675::"Denies"} nocturnal pain.  Difficulty dressing/grooming: {ACTIONS;DENIES/REPORTS:21021675::"Denies"} Difficulty climbing stairs: {ACTIONS;DENIES/REPORTS:21021675::"Denies"} Difficulty getting out of chair: {ACTIONS;DENIES/REPORTS:21021675::"Denies"} Difficulty using hands for taps, buttons, cutlery, and/or writing: {ACTIONS;DENIES/REPORTS:21021675::"Denies"}  No Rheumatology ROS completed.   PMFS History:  Patient Active Problem List   Diagnosis Date Noted  . Osteoarthritis of lumbar spine 02/13/2017  . History of gastroesophageal reflux (GERD) 02/12/2017  . Rheumatoid arthritis with rheumatoid factor of multiple sites without organ or systems involvement (HCC) 11/08/2016  . High risk medication use 11/08/2016  . Dyspnea 10/14/2013    Past Medical History:  Diagnosis Date  . Acute blood loss anemia 04/30/2012  . Anemia    with 1st pregnancy  . 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 GAD (generalized anxiety disorder)   . GERD (gastroesophageal reflux disease) 2003   years ago - no meds now  . Hypertension    since age 41- usually ok when pregnant  . Hyperthyroidism    Had hyperthyroidism in first pregnancy, subsequently treated with radioiodine  . Hypothyroidism    s/p radioiodine treatment for hyperthyroidism  . Hypothyroidism complicating pregnancy 04/30/2012  . Migraines   . PP care - s/p C/S 5/7 - twins 04/29/2012  . Rheumatoid arthritis(714.0)     . Seizure (HCC)   . Throat pain     Family History  Problem Relation Age of Onset  . Goiter Mother   . Migraines Mother   . Hyperlipidemia Mother   . Hypertension Father        paternal grandmother  . Asthma Brother   . Heart disease Unknown        both sets of grandparents  . Rheum arthritis Sister   . Lung cancer Paternal Grandfather   . Diabetes Paternal Grandmother   . Arthritis Maternal Grandmother   . Healthy Son   . Healthy Daughter   . Healthy Daughter    Past Surgical History:  Procedure Laterality Date  . CESAREAN SECTION    . CESAREAN SECTION  04/29/2012   Procedure: CESAREAN SECTION;  Surgeon: 06/29/2012, MD;  Location: [facility];  Service: Gynecology;  Laterality: N/A;  Repeat Cesarean Section; Twins   Social History   Social History Narrative  . Not on file    Objective: Vital Signs: There were no vitals taken for this visit.   Physical Exam   Musculoskeletal Exam: ***  CDAI Exam: No CDAI exam completed.   Investigation: No additional findings.  Imaging: No results found.  Recent Labs: Lab Results  Component Value Date   WBC 6.0 05/02/2018   HGB 13.3 05/02/2018   PLT 286 05/02/2018   NA 139 05/02/2018   K 4.6 05/02/2018   CL 102 05/02/2018   CO2 30 05/02/2018   GLUCOSE 84 05/02/2018   BUN 13 05/02/2018   CREATININE 0.78 05/02/2018   BILITOT 0.4 05/02/2018  ALKPHOS 48 04/16/2017   AST 17 05/02/2018   ALT 9 05/02/2018   PROT 6.9 05/02/2018   ALBUMIN 4.0 04/16/2017   CALCIUM 9.6 05/02/2018   GFRAA 106 05/02/2018    Speciality Comments: No specialty comments available.  Procedures:  No procedures performed Allergies: Propylthiouracil; Wellbutrin [bupropion hcl]; and Sulfa antibiotics   Assessment / Plan:     Visit Diagnoses: No diagnosis found.   Orders: No orders of the defined types were placed in this encounter.  No orders of the defined types were placed in this encounter.   Face-to-face time spent with patient  was *** minutes. Greater than 50% of time was spent in counseling and coordination of care.  Follow-Up Instructions: No follow-ups on file.   Ellen Henri, CMA  Note - This record has been created using Animal nutritionist.  Chart creation errors have been sought, but may not always  have been located. Such creation errors do not reflect on  the standard of medical care.

## 2018-07-29 ENCOUNTER — Other Ambulatory Visit: Payer: Self-pay | Admitting: Physician Assistant

## 2018-07-29 NOTE — Progress Notes (Signed)
Office Visit Note  Patient: Tracey Nichols             Date of Birth: Apr 10, 1971           MRN: [id number]             PCP: Ileana Ladd, MD Referring: Ileana Ladd, MD Visit Date: 07/30/2018 Occupation: @GUAROCC @  Subjective:  Pain and swelling in multiple joints   History of Present Illness: Tracey Nichols is a 47 y.o. 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  She reports she never received a prescription for Rasuvo.  She reports she was diagnosed with influenza A in June and restarted MTX 7 tablets po once a week about 3 weeks ago. Her last dose of MTX was Monday. She reports she has been flaring for the past several weeks.  She states yesterday she was having severe pain in multiple joints. She was having difficulty raising her arms above her head.  She was having severe feet pain and did not feel like she could make it for an appointment yesterday.  She reports pain in swelling in both hands, wrists, elbows, shoulders, ankles, and feet.  She states she is not having any joint pain or joint swelling in bilateral knee joints.   Activities of Daily Living:  Patient reports morning stiffness for  all day.   Patient Reports nocturnal pain.  Difficulty dressing/grooming: Denies Difficulty climbing stairs: Reports Difficulty getting out of chair: Denies Difficulty using hands for taps, buttons, cutlery, and/or writing: Reports  Review of Systems  Constitutional: Positive for fatigue.  HENT: Positive for mouth dryness. Negative for mouth sores and nose dryness.   Eyes: Positive for dryness. Negative for pain and visual disturbance.  Respiratory: Negative for cough, hemoptysis, shortness of breath and difficulty breathing.   Cardiovascular: Negative for chest pain, palpitations, hypertension and swelling in legs/feet.  Gastrointestinal: Positive for diarrhea. Negative for blood in stool and constipation.  Endocrine: Negative for  increased urination.  Genitourinary: Negative for painful urination.  Musculoskeletal: Positive for arthralgias, joint pain, joint swelling and morning stiffness. Negative for myalgias, muscle weakness, muscle tenderness and myalgias.  Skin: Negative for color change, pallor, rash, hair loss, nodules/bumps, skin tightness, ulcers and sensitivity to sunlight.  Allergic/Immunologic: Negative for susceptible to infections.  Neurological: Positive for headaches. Negative for dizziness, numbness and weakness.  Hematological: Negative for swollen glands.  Psychiatric/Behavioral: Positive for sleep disturbance. Negative for depressed mood. The patient is not nervous/anxious.     PMFS History:  Patient Active Problem List   Diagnosis Date Noted  . Osteoarthritis of lumbar spine 02/13/2017  . History of gastroesophageal reflux (GERD) 02/12/2017  . Rheumatoid arthritis with rheumatoid factor of multiple sites without organ or systems involvement (HCC) 11/08/2016  . High risk medication use 11/08/2016  . Dyspnea 10/14/2013    Past Medical History:  Diagnosis Date  . Acute blood loss anemia 04/30/2012  . Anemia    with 1st pregnancy  . 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 GAD (generalized anxiety disorder)   . GERD (gastroesophageal reflux disease) 2003   years ago - no meds now  . Hypertension    since age 16- usually ok when pregnant  . Hyperthyroidism    Had hyperthyroidism in first pregnancy, subsequently treated with radioiodine  . Hypothyroidism    s/p radioiodine treatment for hyperthyroidism  . Hypothyroidism complicating pregnancy 04/30/2012  .  Migraines   . PP care - s/p C/S 5/7 - twins 04/29/2012  . Rheumatoid arthritis(714.0)   . Seizure (HCC)   . Throat pain     Family History  Problem Relation Age of Onset  . Goiter Mother   . Migraines Mother   . Hyperlipidemia Mother   . Hypertension Father        paternal  grandmother  . Asthma Brother   . Heart disease Unknown        both sets of grandparents  . Rheum arthritis Sister   . Lung cancer Paternal Grandfather   . Diabetes Paternal Grandmother   . Arthritis Maternal Grandmother   . Healthy Son   . Healthy Daughter   . Healthy Daughter    Past Surgical History:  Procedure Laterality Date  . CESAREAN SECTION    . CESAREAN SECTION  04/29/2012   Procedure: CESAREAN SECTION;  Surgeon: Lenoard Aden, MD;  Location: [facility];  Service: Gynecology;  Laterality: N/A;  Repeat Cesarean Section; Twins   Social History   Social History Narrative  . Not on file    Objective: Vital Signs: BP 121/86 (BP Location: Left Arm, Patient Position: Sitting, Cuff Size: Small)   Pulse 89   Resp 12   Ht 5\' 2"  (1.575 m)   Wt 168 lb (76.2 kg)   LMP 08/17/2014   BMI 30.73 kg/m    Physical Exam  Constitutional: She is oriented to person, place, and time. She appears well-developed and well-nourished.  HENT:  Head: Normocephalic and atraumatic.  Eyes: Conjunctivae and EOM are normal.  Neck: Normal range of motion.  Cardiovascular: Normal rate, regular rhythm, normal heart sounds and intact distal pulses.  Pulmonary/Chest: Effort normal and breath sounds normal.  Abdominal: Soft. Bowel sounds are normal.  Lymphadenopathy:    She has no cervical adenopathy.  Neurological: She is alert and oriented to person, place, and time.  Skin: Skin is warm and dry. Capillary refill takes less than 2 seconds.  Psychiatric: She has a normal mood and affect. Her behavior is normal.  Nursing note and vitals reviewed.    Musculoskeletal Exam: C-spine, thoracic spine, lumbar spine good ROM.  No midline spinal tenderness.  No SI joint tenderness.  Shoulders full range of motion with some discomfort.  Tenderness of bilateral elbow joints good range of motion bilaterally.  Full range of motion bilateral wrists.  Tenderness of bilateral wrist joints.  MCPs, PIPs, DIPs good range  of motion.  She is complete fist formation bilaterally.  She has synovitis of several MCP and PIP joints as described below.  Hip joints, knee joints good range of motion with no synovitis.  Tenderness and synovitis of bilateral ankle joints. Good ROM of bilateral knee joints. No warmth or effusion of knee joints. No tenderness of trochanteric bursa this time.  She has tenderness and synovitis of all and MTP joints.  Right first PIP joint synovitis.  CDAI Exam: CDAI Homunculus Exam:   Tenderness:  RUE: glenohumeral, ulnohumeral and radiohumeral and wrist LUE: glenohumeral, ulnohumeral and radiohumeral and wrist Right hand: 2nd PIP and 3rd PIP Left hand: 2nd MCP and 3rd MCP RLE: tibiotalar LLE: tibiotalar Right foot: 1st MTP, 2nd MTP, 3rd MTP, 4th MTP, 5th MTP, 1st PIP and 2nd PIP Left foot: 1st MTP, 2nd MTP, 3rd MTP, 4th MTP, 5th MTP, 1st PIP, 2nd PIP and 3rd PIP  Swelling:  Right hand: 1st MCP, 2nd PIP and 3rd PIP Left hand: 1st MCP, 2nd MCP and  3rd MCP RLE: tibiotalar LLE: tibiotalar Right foot: 1st MTP, 2nd MTP, 3rd MTP, 4th MTP, 5th MTP and 1st PIP Left foot: 1st MTP, 2nd MTP, 3rd MTP, 4th MTP, 5th MTP and 1st PIP  Joint Counts:  CDAI Tender Joint count: 10 CDAI Swollen Joint count: 6  Global Assessments:  Patient Global Assessment: 6 Provider Global Assessment: 6  CDAI Calculated Score: 28   Investigation: No additional findings.  Imaging: No results found.  Recent Labs: Lab Results  Component Value Date   WBC 6.0 05/02/2018   HGB 13.3 05/02/2018   PLT 286 05/02/2018   NA 139 05/02/2018   K 4.6 05/02/2018   CL 102 05/02/2018   CO2 30 05/02/2018   GLUCOSE 84 05/02/2018   BUN 13 05/02/2018   CREATININE 0.78 05/02/2018   BILITOT 0.4 05/02/2018   ALKPHOS 48 04/16/2017   AST 17 05/02/2018   ALT 9 05/02/2018   PROT 6.9 05/02/2018   ALBUMIN 4.0 04/16/2017   CALCIUM 9.6 05/02/2018   GFRAA 106 05/02/2018    Speciality Comments: No specialty comments  available.  Procedures:  No procedures performed Allergies: Propylthiouracil; Wellbutrin [bupropion hcl]; and Sulfa antibiotics     Assessment / Plan:     Visit Diagnoses: Rheumatoid arthritis with rheumatoid factor of multiple sites without organ or systems involvement [facility]): She is currently having a rheumatoid arthritis flare. She has synovitis of multiple joints as described above.  She has been having increased joint pain and joint swelling for the past 3 weeks.  Her pain was so severe yesterday that she was unable to drive.  At her last visit we discussed switching her from p.o. form of methotrexate to 6 days injections.  She never received the prescription for Rasuvo.  She was diagnosed with influenza A in June 2019 and held her methotrexate dose until she was feeling better.  She is been on methotrexate for the past 3 weeks.  Her last dose of methotrexate was on Monday.  She has been taking methotrexate 7 tablets by mouth once weekly.  She continues to have recurrent flares.  We discussed the indications, contraindications, and potential side effects of Orencia subcutaneous injections once weekly.  All questions were addressed.  Consent was obtained today in the office.  A prescription for Dub Amis will be sent to the pharmacy today.  She will return for a nurse visit for her first injection in the office.  She will return to the office 3 months after starting Orencia.  She is advised to notify us if she develops increased joint pain or joint swelling.  A prescription for prednisone starting at 20 mg tapering by 5 mg every 4 days was sent to the pharmacy.  Medication counseling:  TB Gold: Pending  Hepatitis panel: Pending HIV: 08/19/13 negative  SPEP: Pending Immunoglobulins: normal   Does patient have a diagnosis of COPD? No  Counseled patient that Dub Amis is a selective T-cell costimulation blocker indicated for rheumatoid arthritis.  Counseled patient on purpose, proper use, and adverse  effects of Orencia. The most common adverse effects are increased risk of infections, headache, and injection site reactions.  There is the possibility of an increased risk of malignancy but it is not well understood if this increased risk is due to the medication or the disease state.  Reviewed the importance of regular labs while on Orencia therapy.  Counseled patient that Dub Amis should be held prior to scheduled surgery.  Counseled patient to avoid live vaccines while on Orencia.  Advised patient to get annual influenza vaccine and the pneumococcal vaccine as indicated.  Provided patient with medication education material and answered all questions.  Patient consented to [facility].  Will upload consent into patient's chart.  Will apply for Orencia through patient's insurance.  Reviewed storage information for Orencia.  Advised initial injection must be administered in office.    High risk medication use -Orencia subq, MTX 7 tablets po once weekly and folic acid 2 mg daily -CBC and CMP will be drawn today to monitor for drug toxicity.  TB gold, SPEP, hep panel will be rechecked today as well.  Plan: COMPLETE METABOLIC PANEL WITH GFR, CBC with Differential/Platelet, QuantiFERON-TB Gold Plus, Serum protein electrophoresis with reflex, Hepatitis B core antibody, IgM, Hepatitis B surface antigen, Hepatitis C antibody  Osteoarthritis of lumbar spine, unspecified spinal osteoarthritis complication status: Chronic pain   History of gastroesophageal reflux (GERD)   Orders: Orders Placed This Encounter  Procedures  . COMPLETE METABOLIC PANEL WITH GFR  . CBC with Differential/Platelet  . QuantiFERON-TB Gold Plus  . Serum protein electrophoresis with reflex  . Hepatitis B core antibody, IgM  . Hepatitis B surface antigen  . Hepatitis C antibody   Meds ordered this encounter  Medications  . predniSONE (DELTASONE) 5 MG tablet    Sig: Take 4 tablets by mouth daily for 4 days, 3 tablets po x4 days, 2 tablets  po x4 days, 1 tablets po x4 days.    Dispense:  40 tablet    Refill:  0    Face-to-face time spent with patient was 30 minutes. Greater than 50% of time was spent in counseling and coordination of care.  Follow-Up Instructions: Return in about 3 months (around 10/30/2018) for Rheumatoid arthritis, Osteoarthritis.   Gearldine Bienenstock, PA-C   I examined and evaluated the patient with Sherron Ales PA.  Patient had active synovitis in multiple joints today.  We had detailed discussion regarding different treatment options and their side effects.  She was given a prednisone taper.  We will apply for  Orencia subcu.  The plan of care was discussed as noted above.  Pollyann Savoy, MD  Note - This record has been created using Animal nutritionist.  Chart creation errors have been sought, but may not always  have been located. Such creation errors do not reflect on  the standard of medical care.

## 2018-07-30 ENCOUNTER — Encounter: Payer: Self-pay | Admitting: Physician Assistant

## 2018-07-30 ENCOUNTER — Telehealth: Payer: Self-pay | Admitting: Pharmacy Technician

## 2018-07-30 ENCOUNTER — Encounter (INDEPENDENT_AMBULATORY_CARE_PROVIDER_SITE_OTHER): Payer: Self-pay

## 2018-07-30 ENCOUNTER — Ambulatory Visit: Payer: BLUE CROSS/BLUE SHIELD | Admitting: Physician Assistant

## 2018-07-30 VITALS — BP 121/86 | HR 89 | Resp 12 | Ht 62.0 in | Wt 168.0 lb

## 2018-07-30 DIAGNOSIS — Z79899 Other long term (current) drug therapy: Secondary | ICD-10-CM | POA: Diagnosis not present

## 2018-07-30 DIAGNOSIS — M47816 Spondylosis without myelopathy or radiculopathy, lumbar region: Secondary | ICD-10-CM

## 2018-07-30 DIAGNOSIS — M0579 Rheumatoid arthritis with rheumatoid factor of multiple sites without organ or systems involvement: Secondary | ICD-10-CM | POA: Diagnosis not present

## 2018-07-30 DIAGNOSIS — Z8719 Personal history of other diseases of the digestive system: Secondary | ICD-10-CM | POA: Diagnosis not present

## 2018-07-30 MED ORDER — PREDNISONE 5 MG PO TABS: ORAL_TABLET | ORAL | 0 refills | Status: DC

## 2018-07-30 NOTE — Telephone Encounter (Signed)
Findings of Benefits Investigation via test claims:  Plan: [employer] PPO  Orencia 125mg  [place] Syringe- prior authorizaiton required  Orencia 125mg  [place] [employer]- prior authorization required  Awaiting prescription to proceed.   2:35 PM , CPhT

## 2018-07-30 NOTE — Patient Instructions (Signed)
Abatacept solution for injection (subcutaneous or intravenous use)  What is this medicine?  ABATACEPT (a ba TA sept) is used to treat moderate to severe active rheumatoid arthritis or psoriatic arthritis in adults. This medicine is also used to treat juvenile idiopathic arthritis.  This medicine may be used for other purposes; ask your health care provider or pharmacist if you have questions.  COMMON BRAND NAME(S): Orencia  What should I tell my health care provider before I take this medicine?  They need to know if you have any of these conditions:  -are taking other medicines to treat rheumatoid arthritis  -COPD  -diabetes  -infection or history of infections  -recently received or scheduled to receive a vaccine  -scheduled to have surgery  -tuberculosis, a positive skin test for tuberculosis or have recently been in close contact with someone who has tuberculosis  -viral hepatitis  -an unusual or allergic reaction to abatacept, other medicines, foods, dyes, or preservatives  -pregnant or trying to get pregnant  -breast-feeding  How should I use this medicine?  This medicine is for infusion into a vein or for injection under the skin. Infusions are given by a health care professional in a hospital or clinic setting. If you are to give your own medicine at home, you will be taught how to prepare and give this medicine under the skin. Use exactly as directed. Take your medicine at regular intervals. Do not take your medicine more often than directed.  It is important that you put your used needles and syringes in a special sharps container. Do not put them in a trash can. If you do not have a sharps container, call your pharmacist or healthcare provider to get one.  Talk to your pediatrician regarding the use of this medicine in children. While infusions in a clinic may be prescribed for children as young as 2 years for selected conditions, precautions do apply.  Overdosage: If you think you have taken too much of  this medicine contact a poison control center or emergency room at once.  NOTE: This medicine is only for you. Do not share this medicine with others.  What if I miss a dose?  This medicine is used once a week if given by injection under the skin. If you miss a dose, take it as soon as you can. If it is almost time for your next dose, take only that dose. Do not take double or extra doses.  If you are to be given an infusion, it is important not to miss your dose. Doses are usually every 4 weeks. Call your doctor or health care professional if you are unable to keep an appointment.  What may interact with this medicine?  Do not take this medicine with any of the following medications:  -adalimumab  -anakinra  -certolizumab  -etanercept  -golimumab  -infliximab  -live virus vaccines  -rituximab  -tocilizumab  This medicine may also interact with the following medications:  -vaccines  This list may not describe all possible interactions. Give your health care provider a list of all the medicines, herbs, non-prescription drugs, or dietary supplements you use. Also tell them if you smoke, drink alcohol, or use illegal drugs. Some items may interact with your medicine.  What should I watch for while using this medicine?  Visit your doctor for regular check ups while you are taking this medicine. Tell your doctor or healthcare professional if your symptoms do not start to get better or if they   get worse.  Call your doctor or health care professional if you get a cold or other infection while receiving this medicine. Do not treat yourself. This medicine may decrease your body's ability to fight infection. Try to avoid being around people who are sick.  What side effects may I notice from receiving this medicine?  Side effects that you should report to your doctor or health care professional as soon as possible:  -allergic reactions like skin rash, itching or hives, swelling of the face, lips, or tongue  -breathing  problems  -chest pain  -signs of infection - fever or chills, cough, unusual tiredness, pain or trouble passing urine, or warm, red or painful skin  Side effects that usually do not require medical attention (report to your doctor or health care professional if they continue or are bothersome):  -dizziness  -headache  -nausea, vomiting  -sore throat  -stomach upset  This list may not describe all possible side effects. Call your doctor for medical advice about side effects. You may report side effects to FDA at 1-800-FDA-1088.  Where should I keep my medicine?  Infusions will be given in a hospital or clinic and will not be stored at home.  Storage for syringes given under the skin and stored at home:  Keep out of the reach of children. Store in a refrigerator between 2 and 8 degrees C (36 and 46 degrees F). Keep this medicine in the original container. Protect from light. Do not freeze. Throw away any unused medicine after the expiration date.  NOTE: This sheet is a summary. It may not cover all possible information. If you have questions about this medicine, talk to your doctor, pharmacist, or health care provider.   2018 Elsevier/Gold Standard (2016-06-28 10:07:35)

## 2018-08-04 LAB — CBC WITH DIFFERENTIAL/PLATELET
BASOS ABS: 39 {cells}/uL (ref 0–200)
Basophils Relative: 0.8 %
EOS ABS: 162 {cells}/uL (ref 15–500)
Eosinophils Relative: 3.3 %
HCT: 38.7 % (ref 35.0–45.0)
HEMOGLOBIN: 13.3 g/dL (ref 11.7–15.5)
Lymphs Abs: 1333 cells/uL (ref 850–3900)
MCH: 31.8 pg (ref 27.0–33.0)
MCHC: 34.4 g/dL (ref 32.0–36.0)
MCV: 92.6 fL (ref 80.0–100.0)
MONOS PCT: 9.6 %
MPV: 10.6 fL (ref 7.5–12.5)
Neutro Abs: 2896 cells/uL (ref 1500–7800)
Neutrophils Relative %: 59.1 %
PLATELETS: 262 10*3/uL (ref 140–400)
RBC: 4.18 10*6/uL (ref 3.80–5.10)
RDW: 13.9 % (ref 11.0–15.0)
TOTAL LYMPHOCYTE: 27.2 %
WBC mixed population: 470 cells/uL (ref 200–950)
WBC: 4.9 10*3/uL (ref 3.8–10.8)

## 2018-08-04 LAB — PROTEIN ELECTROPHORESIS, SERUM, WITH REFLEX
Albumin ELP: 4.1 g/dL (ref 3.8–4.8)
Alpha 1: 0.3 g/dL (ref 0.2–0.3)
Alpha 2: 0.7 g/dL (ref 0.5–0.9)
BETA 2: 0.5 g/dL (ref 0.2–0.5)
BETA GLOBULIN: 0.5 g/dL (ref 0.4–0.6)
Gamma Globulin: 1 g/dL (ref 0.8–1.7)
Total Protein: 7 g/dL (ref 6.1–8.1)

## 2018-08-04 LAB — QUANTIFERON-TB GOLD PLUS
Mitogen-NIL: >10 IU/mL
NIL: 0.02 [IU]/mL
QUANTIFERON-TB GOLD PLUS: NEGATIVE
TB1-NIL: 0 IU/mL
TB2-NIL: 0.01 IU/mL

## 2018-08-04 LAB — COMPLETE METABOLIC PANEL WITH GFR
AG RATIO: 1.6 (calc) (ref 1.0–2.5)
ALT: 9 U/L (ref 6–29)
AST: 17 U/L (ref 10–35)
Albumin: 4.5 g/dL (ref 3.6–5.1)
Alkaline phosphatase (APISO): 72 U/L (ref 33–115)
BUN: 15 mg/dL (ref 7–25)
CALCIUM: 9.4 mg/dL (ref 8.6–10.2)
CO2: 27 mmol/L (ref 20–32)
Chloride: 103 mmol/L (ref 98–110)
Creat: 0.82 mg/dL (ref 0.50–1.10)
GFR, EST AFRICAN AMERICAN: 100 mL/min/{1.73_m2} (ref >=60)
GFR, EST NON AFRICAN AMERICAN: 86 mL/min/{1.73_m2} (ref >=60)
Globulin: 2.9 g/dL (calc) (ref 1.9–3.7)
Glucose, Bld: 77 mg/dL (ref 65–99)
POTASSIUM: 4.1 mmol/L (ref 3.5–5.3)
SODIUM: 139 mmol/L (ref 135–146)
TOTAL PROTEIN: 7.4 g/dL (ref 6.1–8.1)
Total Bilirubin: 0.4 mg/dL (ref 0.2–1.2)

## 2018-08-04 LAB — IFE INTERPRETATION: Immunofix Electr Int: NOT DETECTED

## 2018-08-04 LAB — HEPATITIS B SURFACE ANTIGEN: Hepatitis B Surface Ag: NONREACTIVE

## 2018-08-04 LAB — HEPATITIS C ANTIBODY
Hepatitis C Ab: NONREACTIVE
SIGNAL TO CUT-OFF: 0.01 (ref <1.00)

## 2018-08-04 LAB — HEPATITIS B CORE ANTIBODY, IGM: Hep B C IgM: NONREACTIVE

## 2018-08-04 NOTE — Progress Notes (Signed)
All labs are WNL

## 2018-08-05 ENCOUNTER — Ambulatory Visit: Payer: BLUE CROSS/BLUE SHIELD | Admitting: Rheumatology

## 2018-08-05 ENCOUNTER — Other Ambulatory Visit: Payer: Self-pay

## 2018-08-05 ENCOUNTER — Telehealth: Payer: Self-pay | Admitting: Pharmacy Technician

## 2018-08-05 MED ORDER — ABATACEPT 125 MG/ML ~~LOC~~ SOAJ: 125.0000 mg | SUBCUTANEOUS | 0 refills | Status: DC

## 2018-08-05 NOTE — Telephone Encounter (Signed)
Received a Prior Authorization request from [place] for [facility]. Authorization has been submitted to patient's insurance via Cover My Meds. Will update once we receive a response.  11:19 AM Dorthula Nettles, CPhT

## 2018-08-07 NOTE — Telephone Encounter (Signed)
Received a fax regarding Prior Authorization for Orencia [place]. Authorization has been DENIED because patient has only tried Humira. Plan requires patient to have tried and failed Xeljanz, and 2 of the following: Enbrel, Simponi, Humira.  Will send document to scan center.  We can apply for the [facility].  Phone# [phone]  4:03 PM Rosangelica Pevehouse Johnney Ou, CPhT

## 2018-08-08 ENCOUNTER — Other Ambulatory Visit: Payer: Self-pay | Admitting: Pharmacist

## 2018-08-08 NOTE — Telephone Encounter (Signed)
[place] appeal letter sent to insurance.

## 2018-08-08 NOTE — Telephone Encounter (Signed)
Called patient to schedule a day she could come to the office to complete forms for the [facility] manufacturer assistance. No answer and the patient's mailbox was full. Will follow up next week.  3:10 PM Dorthula Nettles, CPhT

## 2018-08-13 ENCOUNTER — Other Ambulatory Visit: Payer: Self-pay | Admitting: Rheumatology

## 2018-08-13 NOTE — Telephone Encounter (Signed)
Called patient to schedule a day she could come to the office to complete forms for the [facility] manufacturer assistance. No answer and the patient's mailbox was full. Will continue to follow up.  10:56 AM Dorthula Nettles, CPhT

## 2018-08-13 NOTE — Telephone Encounter (Signed)
Last Visit: 07/30/18 Next visit: 11/06/18 Labs: 07/30/18 WNL  Okay to refill per Dr. Corliss Skains

## 2018-08-14 ENCOUNTER — Telehealth: Payer: Self-pay | Admitting: Pharmacist

## 2018-08-14 NOTE — Telephone Encounter (Signed)
Appeal for Orencia denied.  Have tried to reach patient multiple times without success to sign up for patient assistance that will cover medication for 1 year.  Will try second appeal when patient applies for patient assistance and commits to starting Orencia.

## 2018-08-14 NOTE — Telephone Encounter (Signed)
Received a fax regarding Appeal for Orencia [place]. Authorization has been DENIED because does not meet plan's medical necessity because the patient has only tried Humira.  I have been trying to reach patient to apply for Manufacturer Assistance and to discuss prior authorization status, but her voicemail is full and we are awaiting a call back.  Will send document to scan center.  Phone# [phone]  8:17 AM Dorthula Nettles, CPhT

## 2018-08-26 DIAGNOSIS — E559 Vitamin D deficiency, unspecified: Secondary | ICD-10-CM | POA: Diagnosis not present

## 2018-08-26 DIAGNOSIS — M069 Rheumatoid arthritis, unspecified: Secondary | ICD-10-CM | POA: Diagnosis not present

## 2018-09-02 ENCOUNTER — Telehealth: Payer: Self-pay | Admitting: Rheumatology

## 2018-09-02 NOTE — Telephone Encounter (Signed)
Patient left a voicemail stating she was returning your call.   

## 2018-09-02 NOTE — Telephone Encounter (Signed)
Returned patient's call to discuss patient assistance. No answer, voicemail is full

## 2018-09-03 NOTE — Telephone Encounter (Signed)
Attempted to contact patient and no answer. Unable to leave a message mailbox is full.

## 2018-09-05 NOTE — Telephone Encounter (Signed)
Left voicemail for patient to see when she could come in to sign up for Manufacturer Assistance for Orencia. Rph Amber will initiate 2nd Appeal once patient has been enrolled and commits to starting Orencia.  3:39 PM Dorthula Nettles, CPhT

## 2018-09-12 NOTE — Telephone Encounter (Signed)
Left message for patient to call back. See notes below.

## 2018-09-25 NOTE — Telephone Encounter (Signed)
Left message for patient to call back. See notes below. 

## 2018-10-23 DIAGNOSIS — R05 Cough: Secondary | ICD-10-CM | POA: Diagnosis not present

## 2018-10-23 NOTE — Progress Notes (Deleted)
Office Visit Note  Patient: Tracey Nichols             Date of Birth: 11/01/1971           MRN: [id number]             PCP: Ileana Ladd, MD Referring: Ileana Ladd, MD Visit Date: 11/06/2018 Occupation: @GUAROCC @  Subjective:  No chief complaint on file.  Current regimen includes methotrexate 17.5 mg po weekly and folic acid 2 mg daily.  Last office visit on 07/30/18 patient was consented for Orencia but has not signed up for patient assistance.  She has failed both Plaquenil and Humira.  She has a history of recurrent infections.  Last TB gold negative on 07/30/18.  Most recent CBC/CMP within normal limits on 07/30/18. She is due for CBC/CMP today and then every 3 months to monitor for drug toxicity. Standing orders are in place. Recommend flu, Pneumovax 23 vaccineas indicated.  History of Present Illness: Tracey Nichols is a 47 y.o. 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  Activities of Daily Living:  Patient reports morning stiffness for *** {minute/hour:19697}.   Patient {ACTIONS;DENIES/REPORTS:21021675::"Denies"} nocturnal pain.  Difficulty dressing/grooming: {ACTIONS;DENIES/REPORTS:21021675::"Denies"} Difficulty climbing stairs: {ACTIONS;DENIES/REPORTS:21021675::"Denies"} Difficulty getting out of chair: {ACTIONS;DENIES/REPORTS:21021675::"Denies"} Difficulty using hands for taps, buttons, cutlery, and/or writing: {ACTIONS;DENIES/REPORTS:21021675::"Denies"}  No Rheumatology ROS completed.   PMFS History:  Patient Active Problem List   Diagnosis Date Noted  . Osteoarthritis of lumbar spine 02/13/2017  . History of gastroesophageal reflux (GERD) 02/12/2017  . Rheumatoid arthritis with rheumatoid factor of multiple sites without organ or systems involvement (HCC) 11/08/2016  . High risk medication use 11/08/2016  . Dyspnea 10/14/2013    Past Medical History:  Diagnosis Date  . Acute blood loss anemia 04/30/2012  . Anemia      with 1st pregnancy  . 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 GAD (generalized anxiety disorder)   . GERD (gastroesophageal reflux disease) 2003   years ago - no meds now  . Hypertension    since age 39- usually ok when pregnant  . Hyperthyroidism    Had hyperthyroidism in first pregnancy, subsequently treated with radioiodine  . Hypothyroidism    s/p radioiodine treatment for hyperthyroidism  . Hypothyroidism complicating pregnancy 04/30/2012  . Migraines   . PP care - s/p C/S 5/7 - twins 04/29/2012  . Rheumatoid arthritis(714.0)   . Seizure (HCC)   . Throat pain     Family History  Problem Relation Age of Onset  . Goiter Mother   . Migraines Mother   . Hyperlipidemia Mother   . Hypertension Father        paternal grandmother  . Asthma Brother   . Heart disease Unknown        both sets of grandparents  . Rheum arthritis Sister   . Lung cancer Paternal Grandfather   . Diabetes Paternal Grandmother   . Arthritis Maternal Grandmother   . Healthy Son   . Healthy Daughter   . Healthy Daughter    Past Surgical History:  Procedure Laterality Date  . CESAREAN SECTION    . CESAREAN SECTION  04/29/2012   Procedure: CESAREAN SECTION;  Surgeon: 06/29/2012, MD;  Location: [facility];  Service: Gynecology;  Laterality: N/A;  Repeat Cesarean Section; Twins   Social History   Social History Narrative  . Not on file    Objective:  Vital Signs: LMP 08/17/2014    Physical Exam   Musculoskeletal Exam: ***  CDAI Exam: CDAI Score: Not documented Patient Global Assessment: Not documented; Provider Global Assessment: Not documented Swollen: Not documented; Tender: Not documented Joint Exam   Not documented   There is currently no information documented on the homunculus. Go to the Rheumatology activity and complete the homunculus joint exam.  Investigation: No additional findings.  Imaging: No results  found.  Recent Labs: Lab Results  Component Value Date   WBC 4.9 07/30/2018   HGB 13.3 07/30/2018   PLT 262 07/30/2018   NA 139 07/30/2018   K 4.1 07/30/2018   CL 103 07/30/2018   CO2 27 07/30/2018   GLUCOSE 77 07/30/2018   BUN 15 07/30/2018   CREATININE 0.82 07/30/2018   BILITOT 0.4 07/30/2018   ALKPHOS 48 04/16/2017   AST 17 07/30/2018   ALT 9 07/30/2018   PROT 7.4 07/30/2018   PROT 7.0 07/30/2018   ALBUMIN 4.0 04/16/2017   CALCIUM 9.4 07/30/2018   GFRAA 100 07/30/2018   QFTBGOLDPLUS NEGATIVE 07/30/2018    Speciality Comments: No specialty comments available.  Procedures:  No procedures performed Allergies: Propylthiouracil; Wellbutrin [bupropion hcl]; and Sulfa antibiotics   Assessment / Plan:     Visit Diagnoses: Rheumatoid arthritis with rheumatoid factor of multiple sites without organ or systems involvement (HCC)  High risk medication use - started on Orencia, MTX 7 tablets po once weekly, and folic acid 2 mg po daily  Osteoarthritis of lumbar spine, unspecified spinal osteoarthritis complication status  History of gastroesophageal reflux (GERD)   Orders: No orders of the defined types were placed in this encounter.  No orders of the defined types were placed in this encounter.   Face-to-face time spent with patient was *** minutes. Greater than 50% of time was spent in counseling and coordination of care.  Follow-Up Instructions: No follow-ups on file.   Gearldine Bienenstock, PA-C  Note - This record has been created using Dragon software.  Chart creation errors have been sought, but may not always  have been located. Such creation errors do not reflect on  the standard of medical care.

## 2018-10-29 DIAGNOSIS — R270 Ataxia, unspecified: Secondary | ICD-10-CM | POA: Diagnosis not present

## 2018-10-29 DIAGNOSIS — R292 Abnormal reflex: Secondary | ICD-10-CM | POA: Diagnosis not present

## 2018-10-29 DIAGNOSIS — E559 Vitamin D deficiency, unspecified: Secondary | ICD-10-CM | POA: Diagnosis not present

## 2018-10-29 DIAGNOSIS — M791 Myalgia, unspecified site: Secondary | ICD-10-CM | POA: Diagnosis not present

## 2018-10-29 DIAGNOSIS — R9082 White matter disease, unspecified: Secondary | ICD-10-CM | POA: Diagnosis not present

## 2018-10-29 DIAGNOSIS — R202 Paresthesia of skin: Secondary | ICD-10-CM | POA: Diagnosis not present

## 2018-10-29 DIAGNOSIS — M5412 Radiculopathy, cervical region: Secondary | ICD-10-CM | POA: Diagnosis not present

## 2018-10-29 DIAGNOSIS — G510 Bell's palsy: Secondary | ICD-10-CM | POA: Diagnosis not present

## 2018-10-30 ENCOUNTER — Other Ambulatory Visit: Payer: Self-pay | Admitting: Rheumatology

## 2018-10-30 NOTE — Telephone Encounter (Addendum)
Last Visit: 07/30/18 Next visit: 11/06/18 Labs: 07/30/18 WNL  Left message to advise patient she is due for labs.  Okay to refill 30 day supply per Dr. Corliss Skains

## 2018-10-31 ENCOUNTER — Other Ambulatory Visit: Payer: Self-pay | Admitting: Specialist

## 2018-10-31 DIAGNOSIS — G510 Bell's palsy: Secondary | ICD-10-CM

## 2018-10-31 DIAGNOSIS — R4781 Slurred speech: Secondary | ICD-10-CM

## 2018-10-31 DIAGNOSIS — R9082 White matter disease, unspecified: Secondary | ICD-10-CM

## 2018-11-06 ENCOUNTER — Ambulatory Visit: Payer: BLUE CROSS/BLUE SHIELD | Admitting: Physician Assistant

## 2018-11-07 DIAGNOSIS — M5416 Radiculopathy, lumbar region: Secondary | ICD-10-CM | POA: Diagnosis not present

## 2018-11-09 ENCOUNTER — Ambulatory Visit
Admission: RE | Admit: 2018-11-09 | Discharge: 2018-11-09 | Disposition: A | Discharge status: Home / Self Care | Payer: BLUE CROSS/BLUE SHIELD | Source: Ambulatory Visit | Attending: Specialist | Admitting: Specialist

## 2018-11-09 DIAGNOSIS — R4781 Slurred speech: Secondary | ICD-10-CM | POA: Diagnosis not present

## 2018-11-09 DIAGNOSIS — R9082 White matter disease, unspecified: Secondary | ICD-10-CM

## 2018-11-09 DIAGNOSIS — R200 Anesthesia of skin: Secondary | ICD-10-CM | POA: Diagnosis not present

## 2018-11-09 DIAGNOSIS — G510 Bell's palsy: Secondary | ICD-10-CM

## 2018-11-14 DIAGNOSIS — M5441 Lumbago with sciatica, right side: Secondary | ICD-10-CM | POA: Diagnosis not present

## 2018-11-14 DIAGNOSIS — M0609 Rheumatoid arthritis without rheumatoid factor, multiple sites: Secondary | ICD-10-CM | POA: Diagnosis not present

## 2018-11-18 ENCOUNTER — Telehealth: Payer: Self-pay | Admitting: *Deleted

## 2018-11-18 MED ORDER — FOLIC ACID 1 MG PO TABS: 2.0000 mg | ORAL_TABLET | Freq: Every day | ORAL | 4 refills | Status: DC

## 2018-11-18 NOTE — Telephone Encounter (Signed)
Refill request via fax  Last Visit: 07/30/18 Next visit was due November 2019. Message sent to the front to schedule patient   Okay to refill per Dr. Corliss Skains

## 2018-11-18 NOTE — Telephone Encounter (Signed)
Please schedule patient for a follow up appointment. Patient was due November 2019. Thanks!

## 2018-11-18 NOTE — Telephone Encounter (Signed)
LMOM for patient to call and schedule follow-up appointment.   °

## 2018-11-19 DIAGNOSIS — M5412 Radiculopathy, cervical region: Secondary | ICD-10-CM | POA: Diagnosis not present

## 2018-11-19 DIAGNOSIS — M5414 Radiculopathy, thoracic region: Secondary | ICD-10-CM | POA: Diagnosis not present

## 2018-11-19 DIAGNOSIS — M5481 Occipital neuralgia: Secondary | ICD-10-CM | POA: Diagnosis not present

## 2018-11-19 DIAGNOSIS — G510 Bell's palsy: Secondary | ICD-10-CM | POA: Diagnosis not present

## 2018-11-19 DIAGNOSIS — R9082 White matter disease, unspecified: Secondary | ICD-10-CM | POA: Diagnosis not present

## 2018-11-19 DIAGNOSIS — G5603 Carpal tunnel syndrome, bilateral upper limbs: Secondary | ICD-10-CM | POA: Diagnosis not present

## 2018-11-19 DIAGNOSIS — M5417 Radiculopathy, lumbosacral region: Secondary | ICD-10-CM | POA: Diagnosis not present

## 2018-11-19 DIAGNOSIS — R202 Paresthesia of skin: Secondary | ICD-10-CM | POA: Diagnosis not present

## 2018-11-25 DIAGNOSIS — R21 Rash and other nonspecific skin eruption: Secondary | ICD-10-CM | POA: Diagnosis not present

## 2018-11-25 DIAGNOSIS — M5416 Radiculopathy, lumbar region: Secondary | ICD-10-CM | POA: Diagnosis not present

## 2018-12-30 DIAGNOSIS — Z124 Encounter for screening for malignant neoplasm of cervix: Secondary | ICD-10-CM | POA: Diagnosis not present

## 2018-12-30 DIAGNOSIS — Z1231 Encounter for screening mammogram for malignant neoplasm of breast: Secondary | ICD-10-CM | POA: Diagnosis not present

## 2018-12-30 DIAGNOSIS — Z6830 Body mass index (BMI) 30.0-30.9, adult: Secondary | ICD-10-CM | POA: Diagnosis not present

## 2018-12-30 DIAGNOSIS — Z01419 Encounter for gynecological examination (general) (routine) without abnormal findings: Secondary | ICD-10-CM | POA: Diagnosis not present

## 2018-12-30 DIAGNOSIS — Z1151 Encounter for screening for human papillomavirus (HPV): Secondary | ICD-10-CM | POA: Diagnosis not present

## 2019-01-09 DIAGNOSIS — I10 Essential (primary) hypertension: Secondary | ICD-10-CM | POA: Diagnosis not present

## 2019-01-09 DIAGNOSIS — Z6830 Body mass index (BMI) 30.0-30.9, adult: Secondary | ICD-10-CM | POA: Diagnosis not present

## 2019-01-09 DIAGNOSIS — E669 Obesity, unspecified: Secondary | ICD-10-CM | POA: Diagnosis not present

## 2019-01-13 DIAGNOSIS — F430 Acute stress reaction: Secondary | ICD-10-CM | POA: Diagnosis not present

## 2019-02-11 DIAGNOSIS — F430 Acute stress reaction: Secondary | ICD-10-CM | POA: Diagnosis not present

## 2019-02-23 DIAGNOSIS — E890 Postprocedural hypothyroidism: Secondary | ICD-10-CM | POA: Diagnosis not present

## 2019-03-16 DIAGNOSIS — F430 Acute stress reaction: Secondary | ICD-10-CM | POA: Diagnosis not present

## 2019-03-23 DIAGNOSIS — M5441 Lumbago with sciatica, right side: Secondary | ICD-10-CM | POA: Diagnosis not present

## 2019-03-23 DIAGNOSIS — M0609 Rheumatoid arthritis without rheumatoid factor, multiple sites: Secondary | ICD-10-CM | POA: Diagnosis not present

## 2019-03-24 DIAGNOSIS — F430 Acute stress reaction: Secondary | ICD-10-CM | POA: Diagnosis not present

## 2019-03-31 DIAGNOSIS — F430 Acute stress reaction: Secondary | ICD-10-CM | POA: Diagnosis not present

## 2019-04-06 DIAGNOSIS — F430 Acute stress reaction: Secondary | ICD-10-CM | POA: Diagnosis not present

## 2019-04-16 DIAGNOSIS — F430 Acute stress reaction: Secondary | ICD-10-CM | POA: Diagnosis not present

## 2019-04-20 DIAGNOSIS — F430 Acute stress reaction: Secondary | ICD-10-CM | POA: Diagnosis not present

## 2019-05-05 DIAGNOSIS — M069 Rheumatoid arthritis, unspecified: Secondary | ICD-10-CM | POA: Diagnosis not present

## 2019-05-05 DIAGNOSIS — I10 Essential (primary) hypertension: Secondary | ICD-10-CM | POA: Diagnosis not present

## 2019-05-05 DIAGNOSIS — R509 Fever, unspecified: Secondary | ICD-10-CM | POA: Diagnosis not present

## 2019-05-05 DIAGNOSIS — F4323 Adjustment disorder with mixed anxiety and depressed mood: Secondary | ICD-10-CM | POA: Diagnosis not present

## 2019-05-06 DIAGNOSIS — R509 Fever, unspecified: Secondary | ICD-10-CM | POA: Diagnosis not present

## 2019-05-13 DIAGNOSIS — F430 Acute stress reaction: Secondary | ICD-10-CM | POA: Diagnosis not present

## 2019-05-21 DIAGNOSIS — F430 Acute stress reaction: Secondary | ICD-10-CM | POA: Diagnosis not present

## 2019-07-09 DIAGNOSIS — M0609 Rheumatoid arthritis without rheumatoid factor, multiple sites: Secondary | ICD-10-CM | POA: Diagnosis not present

## 2019-07-09 DIAGNOSIS — M5441 Lumbago with sciatica, right side: Secondary | ICD-10-CM | POA: Diagnosis not present

## 2019-08-04 DIAGNOSIS — M79672 Pain in left foot: Secondary | ICD-10-CM | POA: Diagnosis not present

## 2019-08-04 DIAGNOSIS — S99922A Unspecified injury of left foot, initial encounter: Secondary | ICD-10-CM | POA: Diagnosis not present

## 2019-08-20 DIAGNOSIS — M069 Rheumatoid arthritis, unspecified: Secondary | ICD-10-CM | POA: Diagnosis not present

## 2019-08-20 DIAGNOSIS — E559 Vitamin D deficiency, unspecified: Secondary | ICD-10-CM | POA: Diagnosis not present

## 2019-08-20 DIAGNOSIS — F4323 Adjustment disorder with mixed anxiety and depressed mood: Secondary | ICD-10-CM | POA: Diagnosis not present

## 2019-08-20 DIAGNOSIS — K219 Gastro-esophageal reflux disease without esophagitis: Secondary | ICD-10-CM | POA: Diagnosis not present

## 2019-08-24 DIAGNOSIS — F430 Acute stress reaction: Secondary | ICD-10-CM | POA: Diagnosis not present

## 2019-09-02 DIAGNOSIS — F430 Acute stress reaction: Secondary | ICD-10-CM | POA: Diagnosis not present

## 2019-09-17 DIAGNOSIS — F4323 Adjustment disorder with mixed anxiety and depressed mood: Secondary | ICD-10-CM | POA: Diagnosis not present

## 2019-09-17 DIAGNOSIS — E559 Vitamin D deficiency, unspecified: Secondary | ICD-10-CM | POA: Diagnosis not present

## 2019-09-17 DIAGNOSIS — M069 Rheumatoid arthritis, unspecified: Secondary | ICD-10-CM | POA: Diagnosis not present

## 2019-09-17 DIAGNOSIS — K219 Gastro-esophageal reflux disease without esophagitis: Secondary | ICD-10-CM | POA: Diagnosis not present

## 2019-10-12 DIAGNOSIS — M5441 Lumbago with sciatica, right side: Secondary | ICD-10-CM | POA: Diagnosis not present

## 2019-10-12 DIAGNOSIS — M0609 Rheumatoid arthritis without rheumatoid factor, multiple sites: Secondary | ICD-10-CM | POA: Diagnosis not present

## 2019-10-29 IMAGING — MR MR HEAD W/O CM
8 series · 48 of 48 positions shown · non-contrast
Comparison: Brain MRI 03/06/2017

CLINICAL DATA: Slurred speech and right arm weakness. Tongue
numbness.

EXAM:
MRI HEAD WITHOUT CONTRAST
TECHNIQUE: Multiplanar, multiecho pulse sequences of the brain and surrounding
structures were obtained without intravenous contrast.

[Series 2: T1 · sagittal · 5.0mm · 0.90mm/px · 3 of 27 slices shown]
[im 1/27]
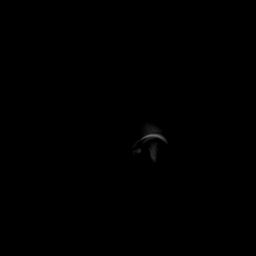
[im 14/27]
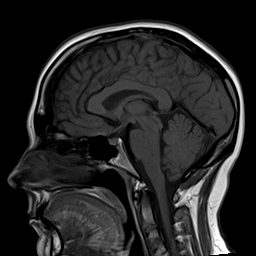
[im 27/27]
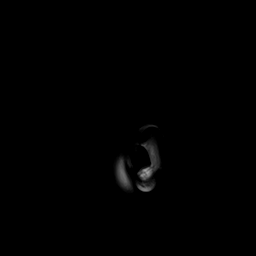

[Series 3: DWI · axial · 3.0mm · 1.88mm/px · z∈[-66,+89]mm · 12 of 96 slices shown (1 of 2)]
[im 1/96]
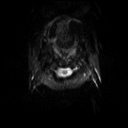
[im 9/96]
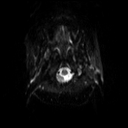
[im 18/96]
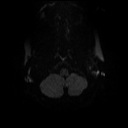
[im 26/96]
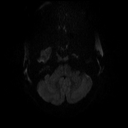
[im 35/96]
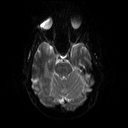
[im 44/96]
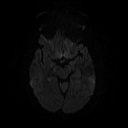
[im 52/96]
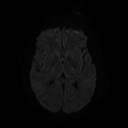
[im 61/96]
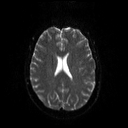
[im 70/96]
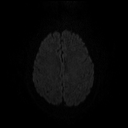
[im 78/96]
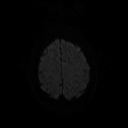
[im 87/96]
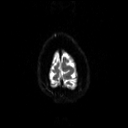
[im 96/96]
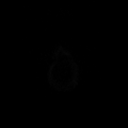

[Series 4: DWI · axial · 3.0mm · 1.88mm/px · z∈[-66,+89]mm · 6 of 48 slices shown (2 of 2)]
[im 1/48]
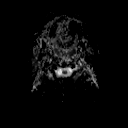
[im 10/48]
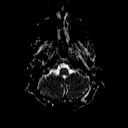
[im 19/48]
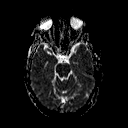
[im 29/48]
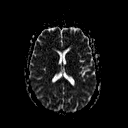
[im 38/48]
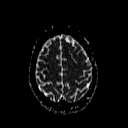
[im 48/48]
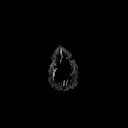

[Series 5: T2 · axial · 5.0mm · 0.69mm/px · z∈[-69,+93]mm · 3 of 28 slices shown (1 of 3)]
[im 1/28]
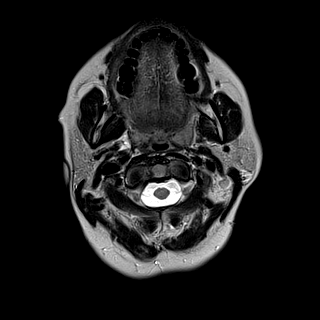
[im 14/28]
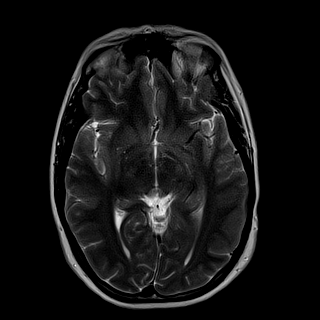
[im 28/28]
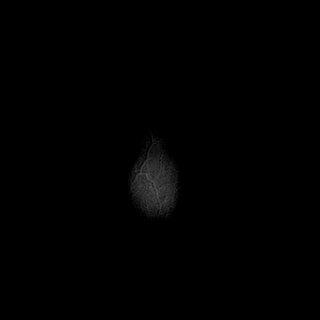

[Series 6: T2 · axial · 5.0mm · 0.43mm/px · z∈[-69,+93]mm · 3 of 28 slices shown (2 of 3)]
[im 1/28]
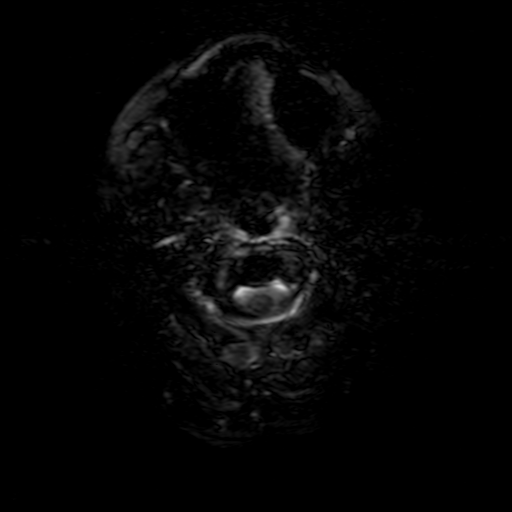
[im 14/28]
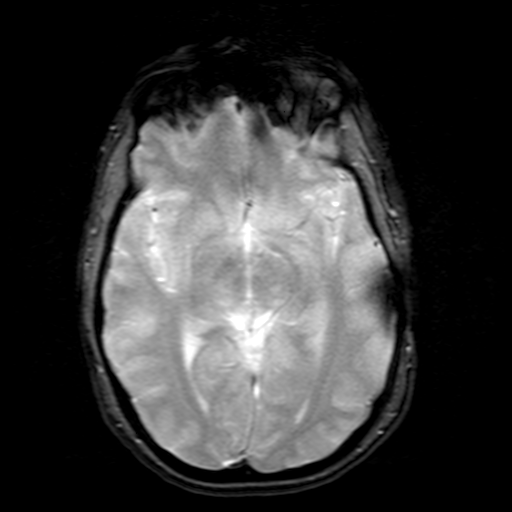
[im 28/28]
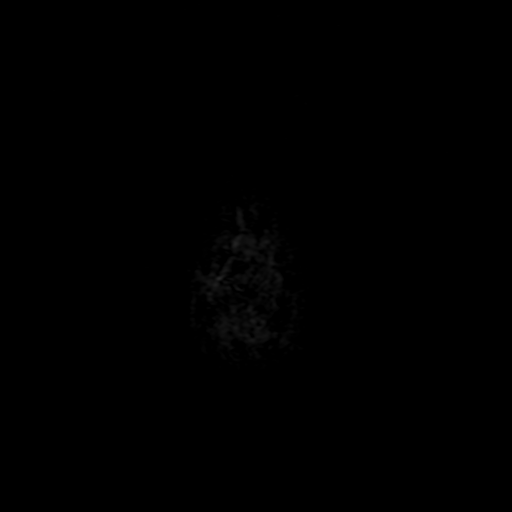

[Series 7: FLAIR · axial · 3.0mm · 0.43mm/px · z∈[-69,+94]mm · 5 of 42 slices shown]
[im 1/42]
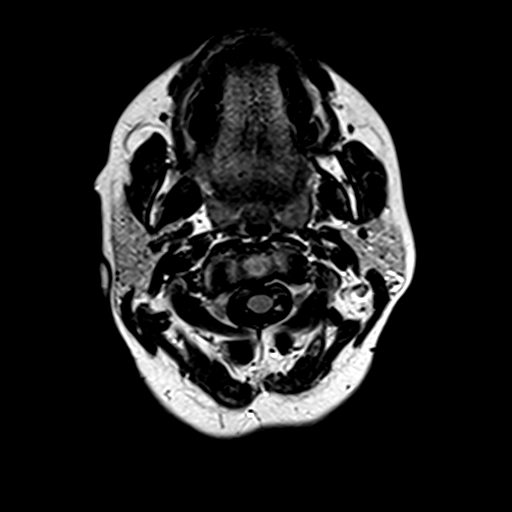
[im 11/42]
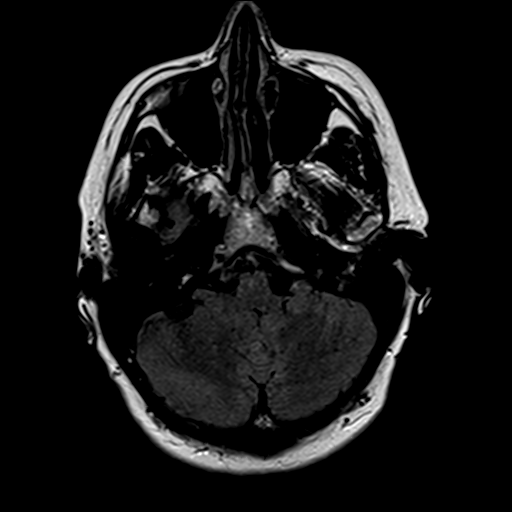
[im 21/42]
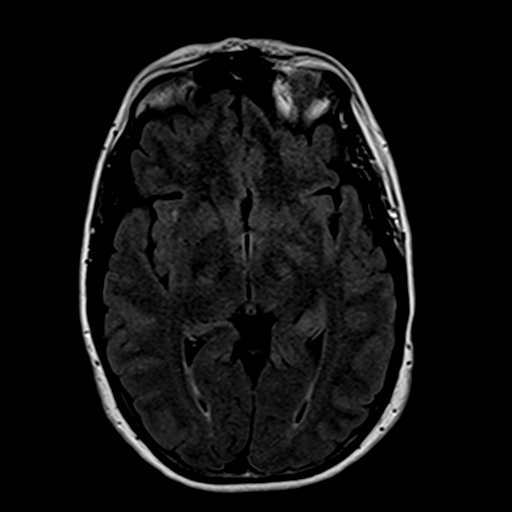
[im 31/42]
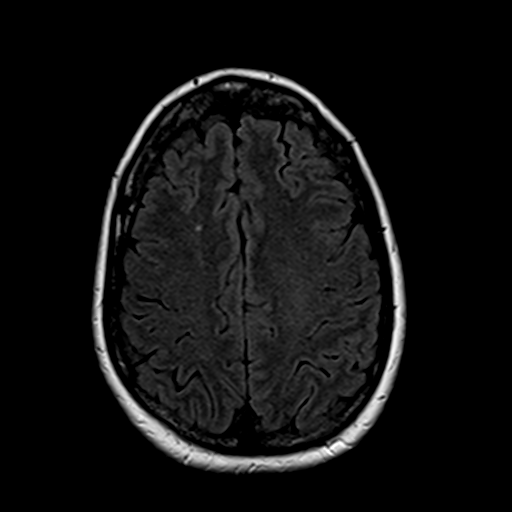
[im 42/42]
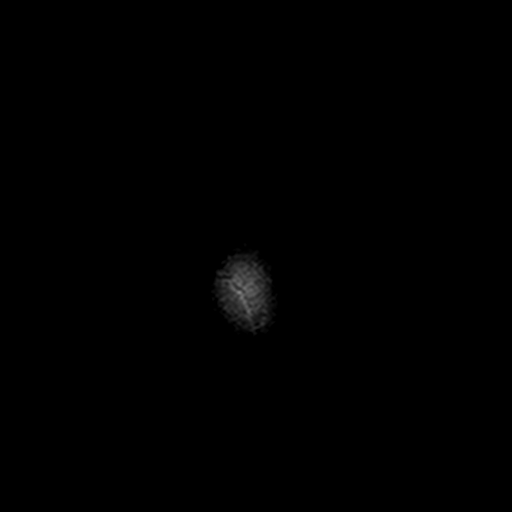

[Series 8: t1_3d_tra · axial · 2.0mm · 0.86mm/px · z∈[-83,+107]mm · 12 of 96 slices shown]
[im 1/96]
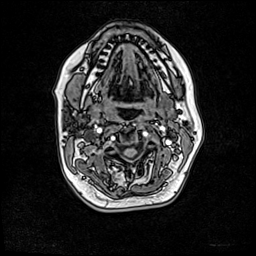
[im 9/96]
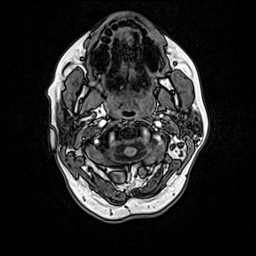
[im 18/96]
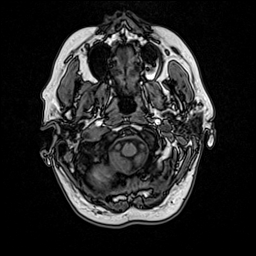
[im 26/96]
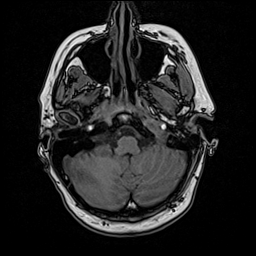
[im 35/96]
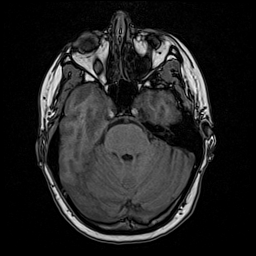
[im 44/96]
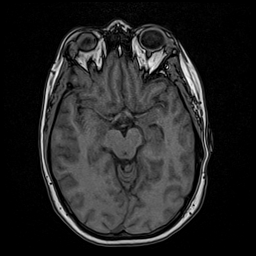
[im 52/96]
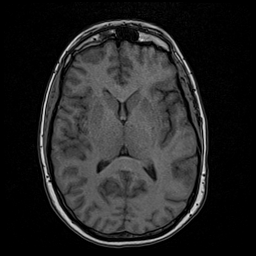
[im 61/96]
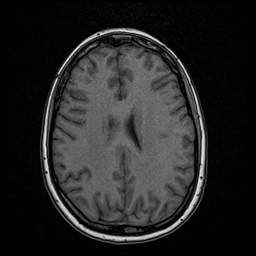
[im 70/96]
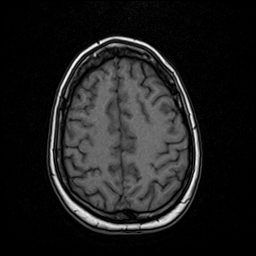
[im 78/96]
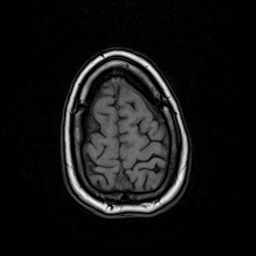
[im 87/96]
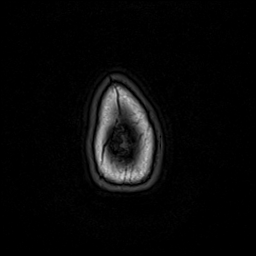
[im 96/96]
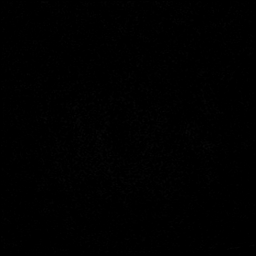

[Series 9: T2 · coronal · 5.0mm · 0.69mm/px · 4 of 30 slices shown (3 of 3)]
[im 1/30]
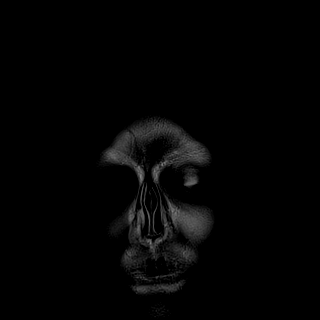
[im 10/30]
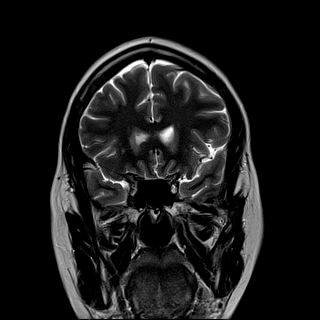
[im 20/30]
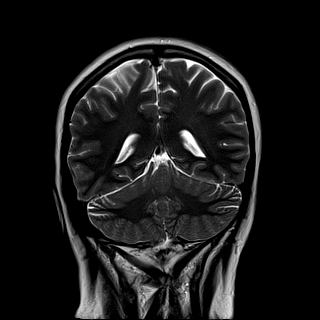
[im 30/30]
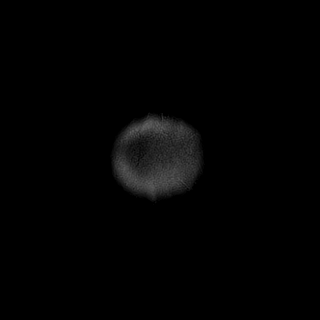

[48 of 48 positions shown; findings below may reference images not displayed]

FINDINGS: BRAIN: There is no acute infarct, acute hemorrhage, hydrocephalus or
extra-axial collection. The midline structures are normal. No
midline shift or other mass effect. There are no old infarcts.
Scattered foci of hyperintense T2-weighted signal within the white
matter in a nonspecific pattern that may be seen in the context of
migraine headaches. The cerebral and cerebellar volume are
age-appropriate. Susceptibility-sensitive sequences show no chronic
microhemorrhage or superficial siderosis.

VASCULAR: Major intracranial arterial and venous sinus flow voids
are normal.

SKULL AND UPPER CERVICAL SPINE: Calvarial bone marrow signal is
normal. There is no skull base mass. Visualized upper cervical spine
and soft tissues are normal.

SINUSES/ORBITS: No fluid levels or advanced mucosal thickening. No
mastoid or middle ear effusion. The orbits are normal.
IMPRESSION: Unchanged distribution of scattered white matter hyperintensities,
which are nonspecific and may be seen in the setting of migraine
headaches or hypertension.

## 2019-11-03 DIAGNOSIS — Z5181 Encounter for therapeutic drug level monitoring: Secondary | ICD-10-CM | POA: Diagnosis not present

## 2019-11-03 DIAGNOSIS — E890 Postprocedural hypothyroidism: Secondary | ICD-10-CM | POA: Diagnosis not present

## 2019-11-03 DIAGNOSIS — R232 Flushing: Secondary | ICD-10-CM | POA: Diagnosis not present

## 2019-11-03 DIAGNOSIS — E559 Vitamin D deficiency, unspecified: Secondary | ICD-10-CM | POA: Diagnosis not present

## 2019-11-03 DIAGNOSIS — Z1159 Encounter for screening for other viral diseases: Secondary | ICD-10-CM | POA: Diagnosis not present

## 2019-11-03 DIAGNOSIS — Z13220 Encounter for screening for lipoid disorders: Secondary | ICD-10-CM | POA: Diagnosis not present

## 2020-01-01 DIAGNOSIS — R232 Flushing: Secondary | ICD-10-CM | POA: Diagnosis not present

## 2020-01-01 DIAGNOSIS — F4323 Adjustment disorder with mixed anxiety and depressed mood: Secondary | ICD-10-CM | POA: Diagnosis not present

## 2020-01-01 DIAGNOSIS — K219 Gastro-esophageal reflux disease without esophagitis: Secondary | ICD-10-CM | POA: Diagnosis not present

## 2020-01-01 DIAGNOSIS — M069 Rheumatoid arthritis, unspecified: Secondary | ICD-10-CM | POA: Diagnosis not present

## 2020-02-02 DIAGNOSIS — M5441 Lumbago with sciatica, right side: Secondary | ICD-10-CM | POA: Diagnosis not present

## 2020-02-02 DIAGNOSIS — M0609 Rheumatoid arthritis without rheumatoid factor, multiple sites: Secondary | ICD-10-CM | POA: Diagnosis not present

## 2020-02-17 DIAGNOSIS — F329 Major depressive disorder, single episode, unspecified: Secondary | ICD-10-CM | POA: Diagnosis not present

## 2020-02-17 DIAGNOSIS — F419 Anxiety disorder, unspecified: Secondary | ICD-10-CM | POA: Diagnosis not present

## 2020-02-17 DIAGNOSIS — Z6831 Body mass index (BMI) 31.0-31.9, adult: Secondary | ICD-10-CM | POA: Diagnosis not present

## 2020-02-17 DIAGNOSIS — Z1231 Encounter for screening mammogram for malignant neoplasm of breast: Secondary | ICD-10-CM | POA: Diagnosis not present

## 2020-02-17 DIAGNOSIS — Z01419 Encounter for gynecological examination (general) (routine) without abnormal findings: Secondary | ICD-10-CM | POA: Diagnosis not present

## 2020-02-17 DIAGNOSIS — Z124 Encounter for screening for malignant neoplasm of cervix: Secondary | ICD-10-CM | POA: Diagnosis not present

## 2020-02-17 DIAGNOSIS — E890 Postprocedural hypothyroidism: Secondary | ICD-10-CM | POA: Diagnosis not present

## 2020-02-17 DIAGNOSIS — N951 Menopausal and female climacteric states: Secondary | ICD-10-CM | POA: Diagnosis not present

## 2020-02-17 DIAGNOSIS — N9412 Deep dyspareunia: Secondary | ICD-10-CM | POA: Diagnosis not present

## 2020-02-24 DIAGNOSIS — E890 Postprocedural hypothyroidism: Secondary | ICD-10-CM | POA: Diagnosis not present

## 2020-02-27 DIAGNOSIS — R079 Chest pain, unspecified: Secondary | ICD-10-CM | POA: Diagnosis not present

## 2020-02-27 DIAGNOSIS — Z882 Allergy status to sulfonamides status: Secondary | ICD-10-CM | POA: Diagnosis not present

## 2020-02-27 DIAGNOSIS — Z888 Allergy status to other drugs, medicaments and biological substances status: Secondary | ICD-10-CM | POA: Diagnosis not present

## 2020-02-27 DIAGNOSIS — R002 Palpitations: Secondary | ICD-10-CM | POA: Diagnosis not present

## 2020-02-27 DIAGNOSIS — F419 Anxiety disorder, unspecified: Secondary | ICD-10-CM | POA: Diagnosis not present

## 2020-02-27 DIAGNOSIS — Z79899 Other long term (current) drug therapy: Secondary | ICD-10-CM | POA: Diagnosis not present

## 2020-03-01 DIAGNOSIS — R197 Diarrhea, unspecified: Secondary | ICD-10-CM | POA: Diagnosis not present

## 2020-03-01 DIAGNOSIS — R0981 Nasal congestion: Secondary | ICD-10-CM | POA: Diagnosis not present

## 2020-03-01 DIAGNOSIS — J029 Acute pharyngitis, unspecified: Secondary | ICD-10-CM | POA: Diagnosis not present

## 2020-03-01 DIAGNOSIS — R002 Palpitations: Secondary | ICD-10-CM | POA: Diagnosis not present

## 2020-03-02 DIAGNOSIS — Z1152 Encounter for screening for COVID-19: Secondary | ICD-10-CM | POA: Diagnosis not present

## 2020-03-02 DIAGNOSIS — J029 Acute pharyngitis, unspecified: Secondary | ICD-10-CM | POA: Diagnosis not present

## 2020-03-30 DIAGNOSIS — M5441 Lumbago with sciatica, right side: Secondary | ICD-10-CM | POA: Diagnosis not present

## 2020-03-30 DIAGNOSIS — M0609 Rheumatoid arthritis without rheumatoid factor, multiple sites: Secondary | ICD-10-CM | POA: Diagnosis not present

## 2020-05-02 DIAGNOSIS — R5383 Other fatigue: Secondary | ICD-10-CM | POA: Diagnosis not present

## 2020-05-02 DIAGNOSIS — R0981 Nasal congestion: Secondary | ICD-10-CM | POA: Diagnosis not present

## 2020-05-02 DIAGNOSIS — R067 Sneezing: Secondary | ICD-10-CM | POA: Diagnosis not present

## 2020-05-02 DIAGNOSIS — R0989 Other specified symptoms and signs involving the circulatory and respiratory systems: Secondary | ICD-10-CM | POA: Diagnosis not present

## 2020-05-02 DIAGNOSIS — Z1152 Encounter for screening for COVID-19: Secondary | ICD-10-CM | POA: Diagnosis not present

## 2020-07-18 DIAGNOSIS — E559 Vitamin D deficiency, unspecified: Secondary | ICD-10-CM | POA: Diagnosis not present

## 2020-07-18 DIAGNOSIS — Z5181 Encounter for therapeutic drug level monitoring: Secondary | ICD-10-CM | POA: Diagnosis not present

## 2020-07-18 DIAGNOSIS — F411 Generalized anxiety disorder: Secondary | ICD-10-CM | POA: Diagnosis not present

## 2020-07-18 DIAGNOSIS — I10 Essential (primary) hypertension: Secondary | ICD-10-CM | POA: Diagnosis not present

## 2020-07-19 DIAGNOSIS — M0609 Rheumatoid arthritis without rheumatoid factor, multiple sites: Secondary | ICD-10-CM | POA: Diagnosis not present

## 2020-07-19 DIAGNOSIS — Z79899 Other long term (current) drug therapy: Secondary | ICD-10-CM | POA: Diagnosis not present

## 2020-07-19 DIAGNOSIS — M5441 Lumbago with sciatica, right side: Secondary | ICD-10-CM | POA: Diagnosis not present

## 2020-11-10 DIAGNOSIS — M5441 Lumbago with sciatica, right side: Secondary | ICD-10-CM | POA: Diagnosis not present

## 2020-11-10 DIAGNOSIS — Z79899 Other long term (current) drug therapy: Secondary | ICD-10-CM | POA: Diagnosis not present

## 2020-11-10 DIAGNOSIS — M0609 Rheumatoid arthritis without rheumatoid factor, multiple sites: Secondary | ICD-10-CM | POA: Diagnosis not present

## 2021-02-02 DIAGNOSIS — F411 Generalized anxiety disorder: Secondary | ICD-10-CM | POA: Diagnosis not present

## 2021-02-02 DIAGNOSIS — I10 Essential (primary) hypertension: Secondary | ICD-10-CM | POA: Diagnosis not present

## 2021-02-02 DIAGNOSIS — E559 Vitamin D deficiency, unspecified: Secondary | ICD-10-CM | POA: Diagnosis not present

## 2021-02-02 DIAGNOSIS — E890 Postprocedural hypothyroidism: Secondary | ICD-10-CM | POA: Diagnosis not present

## 2021-02-08 DIAGNOSIS — F4322 Adjustment disorder with anxiety: Secondary | ICD-10-CM | POA: Diagnosis not present

## 2021-02-15 DIAGNOSIS — E890 Postprocedural hypothyroidism: Secondary | ICD-10-CM | POA: Diagnosis not present

## 2021-02-21 DIAGNOSIS — F4322 Adjustment disorder with anxiety: Secondary | ICD-10-CM | POA: Diagnosis not present

## 2021-02-22 DIAGNOSIS — E890 Postprocedural hypothyroidism: Secondary | ICD-10-CM | POA: Diagnosis not present

## 2021-10-16 ENCOUNTER — Telehealth: Payer: Self-pay

## 2021-10-16 NOTE — Telephone Encounter (Signed)
NOTES SCANNED TO REFERRAL 

## 2021-11-23 ENCOUNTER — Ambulatory Visit: Payer: BLUE CROSS/BLUE SHIELD | Admitting: Cardiology

## 2021-12-06 NOTE — Progress Notes (Signed)
Cardiology Office Note:    Date:  12/07/2021   ID:  Tracey Nichols, DOB 1971-10-10, MRN [id number]  PCP:  Ileana Ladd, MD   [facility] Providers Cardiologist:  Alverda Skeans, MD Referring MD: Ileana Ladd, MD   Chief Complaint/Reason for Referral:  Palpitations   ASSESSMENT & PLAN:    Palpitations Will obtain a 3-day ZIO monitor and echocardiogram.  She had a TSH drawn by her PCP which was within normal limits as well as other laboratories.  We will keep follow-up open-ended.  Snoring I will obtain a sleep study to evaluate further.          Dispo:  No follow-ups on file.     Medication Adjustments/Labs and Tests Ordered: Current medicines are reviewed at length with the patient today.  Concerns regarding medicines are outlined above.   Tests Ordered: No orders of the defined types were placed in this encounter.   Medication Changes: No orders of the defined types were placed in this encounter.   History of Present Illness:    The patient is a 50 y.o. female with the indicated medical history here for for recommendations regarding palpitations.  The patient was seen by her primary care provider in October.  She had reported palpitations occurring after she would take her Enbrel injectable medication.  She tells me that she has had palpitations for a number of years but they have gotten worse over the last year or so.  She tells me she wakes up from sleep with palpitations.  The last a few minutes typically until she either aborts these by taking a deep breath or she takes her clonazepam.  She does not really develop palpitations during the day.  She has gained around 40 pounds over the last few years and does snore.  She is going through both work and home life stress as well.  She notices on the weekends when she might have an extra glass of wine that the palpitations may be stronger.  She is required no emergency room visits or hospitalizations otherwise.  He  denies any paroxysmal, dyspnea, orthopnea, peripheral edema, severe bleeding, or signs or symptoms of stroke.    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   [name]   Fatigue    Fever    GAD (generalized anxiety disorder)    GERD (gastroesophageal reflux disease) 2003   years ago - no meds now   Hypertension    since age 15- usually ok when pregnant   Hypertension    Hyperthyroidism    Had hyperthyroidism in first pregnancy, subsequently treated with radioiodine   Hypothyroidism    s/p radioiodine treatment for hyperthyroidism   Hypothyroidism complicating pregnancy 04/30/2012   Influenza A    Marital stress    Migraines    Nasal abscess    Neuritis    Obesity    Palpitations    Pleural effusion    PP care - s/p C/S 5/7 - twins 04/29/2012   Rheumatoid arthritis(714.0)    Rhinitis    RUQ abdominal pain    Seizure (HCC)    Throat pain    Vitamin D deficiency      Current Medications: Current Meds  Medication Sig  Abatacept (ORENCIA CLICKJECT) 125 MG/ML SOAJ Inject 125 mg into the skin once a week.   acetaminophen (TYLENOL) 500 MG tablet Take 500 mg by mouth every 6 (six) hours as needed. Pt take 2 tablet 1000 mg PRN   aspirin-acetaminophen-caffeine (EXCEDRIN MIGRAINE) 250-250-65 MG tablet Take by mouth every 6 (six) hours as needed for headache.   busPIRone (BUSPAR) 30 MG tablet    clonazePAM (KLONOPIN) 1 MG tablet Take 0.5 mg by mouth as needed for anxiety.    escitalopram (LEXAPRO) 10 MG tablet daily.   Etanercept (ENBREL SURECLICK [place]) Inject 50 mg into the skin.   fluticasone (FLONASE) 50 MCG/ACT nasal spray Place into both nostrils daily.   ibuprofen (ADVIL,MOTRIN) 200 MG tablet Take 200 mg by mouth every 6 (six) hours as needed.   levothyroxine  (SYNTHROID, LEVOTHROID) 100 MCG tablet Take 100 mcg by mouth daily.   losartan (COZAAR) 50 MG tablet Take 50 mg by mouth daily.   [DISCONTINUED] lisinopril (PRINIVIL,ZESTRIL) 5 MG tablet Take 5 mg by mouth daily.    [DISCONTINUED] losartan (COZAAR) 25 MG tablet Take 25 mg by mouth daily.     Allergies:    Propylthiouracil, Wellbutrin [bupropion hcl], and Sulfa antibiotics   Social History:   Social History   Tobacco Use   Smoking status: Former    Packs/day: 0.25    Years: 12.00    Pack years: 3.00    Types: Cigarettes    Quit date: 12/25/2007    Years since quitting: 13.9   Smokeless tobacco: Never  Vaping Use   Vaping Use: Never used  Substance Use Topics   Alcohol use: Yes    Alcohol/week: 3.0 standard drinks    Types: 3 Glasses of wine per week   Drug use: No     Family Hx: Family History  Problem Relation Age of Onset   Goiter Mother    Migraines Mother    Hyperlipidemia Mother    Hypertension Father        paternal grandmother   Asthma Brother    Heart disease Unknown        both sets of grandparents   Rheum arthritis Sister    Lung cancer Paternal Grandfather    Diabetes Paternal Grandmother    Arthritis Maternal Grandmother    Healthy Son    Healthy Daughter    Healthy Daughter      Review of Systems:   Please see the history of present illness.    All other systems reviewed and are negative.  EKGs/Labs/Other Test Reviewed:    EKG:  EKG today: Sinus rhythm Prior CV studies: No available studies  Imaging studies that I have independently reviewed today: No available studies  Recent Labs: No results found for requested labs within last 8760 hours.   Recent Lipid Panel No results found for: CHOL, TRIG, HDL, LDLCALC, LDLDIRECT  Risk Assessment/Calculations:           Physical Exam:    VS:  BP (!) 130/92    Pulse 80    Ht 5\' 2"  (1.575 m)    Wt 175 lb 3.2 oz (79.5 kg)    LMP 08/17/2014    SpO2 96%    BMI 32.04 kg/m    Wt Readings from Last  3 Encounters:  12/07/21 175 lb 3.2 oz (79.5 kg)  07/30/18 168 lb (76.2 kg)  05/02/18 166 lb (75.3 kg)    GENERAL:  No apparent distress, AOx3 HEENT:  No carotid bruits, +2 carotid impulses, no  scleral icterus CAR: RRR  no murmurs, gallops, rubs, or thrills RES:  Clear to auscultation bilaterally ABD:  Soft, nontender, nondistended, positive bowel sounds x 4 VASC:  +2 radial pulses, +2 carotid pulses, palpable pedal pulses NEURO:  CN 2-12 grossly intact; motor and sensory grossly intact PSYCH:  No active depression or anxiety EXT:  No edema, ecchymosis, or cyanosis  Signed, Orbie Pyo, MD  12/07/2021 4:21 PM    [facility] [address] [place], [place], [place]  [address] Phone: [phone]; Fax: [phone]

## 2021-12-07 ENCOUNTER — Encounter: Payer: Self-pay | Admitting: Internal Medicine

## 2021-12-07 ENCOUNTER — Ambulatory Visit: Payer: Self-pay | Admitting: Internal Medicine

## 2021-12-07 ENCOUNTER — Other Ambulatory Visit: Payer: Self-pay

## 2021-12-07 ENCOUNTER — Ambulatory Visit (INDEPENDENT_AMBULATORY_CARE_PROVIDER_SITE_OTHER): Payer: Self-pay

## 2021-12-07 VITALS — BP 130/92 | HR 80 | Ht 62.0 in | Wt 175.2 lb

## 2021-12-07 DIAGNOSIS — R0683 Snoring: Secondary | ICD-10-CM

## 2021-12-07 DIAGNOSIS — R002 Palpitations: Secondary | ICD-10-CM

## 2021-12-07 NOTE — Progress Notes (Unsigned)
Enrolled for [employer] to mail a ZIO XT long term holter monitor to the patients address on file.  

## 2021-12-07 NOTE — Patient Instructions (Signed)
Medication Instructions:  No changes *If you need a refill on your cardiac medications before your next appointment, please call your pharmacy*   Lab Work: none  Testing/Procedures: Your physician has requested that you have an echocardiogram. Echocardiography is a painless test that uses sound waves to create images of your heart. It provides your doctor with information about the size and shape of your heart and how well your hearts chambers and valves are working. This procedure takes approximately one hour. There are no restrictions for this procedure.  Your physician has recommended that you have a sleep study. This test records several body functions during sleep, including: brain activity, eye movement, oxygen and carbon dioxide blood levels, heart rate and rhythm, breathing rate and rhythm, the flow of air through your mouth and nose, snoring, body muscle movements, and chest and belly movement.  Zio Heart Monitor (Patch) see instructions below.    Follow-Up: As needed   Other Instructions ZIO XT- Long Term Monitor Instructions  Your physician has requested you wear a ZIO patch monitor for 3 days.  This is a single patch monitor. [employer] supplies one patch monitor per enrollment. Additional stickers are not available. Please do not apply patch if you will be having a Nuclear Stress Test,  Echocardiogram, Cardiac CT, MRI, or Chest Xray during the period you would be wearing the  monitor. The patch cannot be worn during these tests. You cannot remove and re-apply the  ZIO XT patch monitor.  Your ZIO patch monitor will be mailed 3 day USPS to your address on file. It may take 3-5 days  to receive your monitor after you have been enrolled.  Once you have received your monitor, please review the enclosed instructions. Your monitor  has already been registered assigning a specific monitor serial # to you.  Billing and Patient Assistance Program Information  We have supplied  [employer] with any of your insurance information on file for billing purposes. [employer] offers a sliding scale Patient Assistance Program for patients that do not have  insurance, or whose insurance does not completely cover the cost of the ZIO monitor.  You must apply for the Patient Assistance Program to qualify for this discounted rate.  To apply, please call [employer] at [phone], select option 4, select option 2, ask to apply for  Patient Assistance Program. Meredeth Ide will ask your household income, and how many people  are in your household. They will quote your out-of-pocket cost based on that information.  [employer] will also be able to set up a 1-month, interest-free payment plan if needed.  Applying the monitor   Shave hair from upper left chest.  Hold abrader disc by orange tab. Rub abrader in 40 strokes over the upper left chest as  indicated in your monitor instructions.  Clean area with 4 enclosed alcohol pads. Let dry.  Apply patch as indicated in monitor instructions. Patch will be placed under collarbone on left  side of chest with arrow pointing upward.  Rub patch adhesive wings for 2 minutes. Remove white label marked "1". Remove the white  label marked "2". Rub patch adhesive wings for 2 additional minutes.  While looking in a mirror, press and release button in center of patch. A small green light will  flash 3-4 times. This will be your only indicator that the monitor has been turned on.  Do not shower for the first 24 hours. You may shower after the first 24 hours.  Press the button if you  feel a symptom. You will hear a small click. Record Date, Time and  Symptom in the Patient Logbook.  When you are ready to remove the patch, follow instructions on the last 2 pages of Patient  Logbook. Stick patch monitor onto the last page of Patient Logbook.  Place Patient Logbook in the blue and white box. Use locking tab on box and tape box closed  securely. The blue and white box  has prepaid postage on it. Please place it in the mailbox as  soon as possible. Your physician should have your test results approximately 7 days after the  monitor has been mailed back to [facility].  Call [facility] Customer Care at [phone] if you have questions regarding  your ZIO XT patch monitor. Call them immediately if you see an orange light blinking on your  monitor.  If your monitor falls off in less than 4 days, contact our Monitor department at [phone].  If your monitor becomes loose or falls off after 4 days call [employer] at [phone] for  suggestions on securing your monitor

## 2021-12-08 ENCOUNTER — Ambulatory Visit: Payer: Self-pay | Admitting: Internal Medicine

## 2021-12-13 DIAGNOSIS — R002 Palpitations: Secondary | ICD-10-CM

## 2021-12-13 DIAGNOSIS — R0683 Snoring: Secondary | ICD-10-CM

## 2021-12-26 NOTE — Progress Notes (Signed)
Benign heart monitor, please inform patient.

## 2022-01-05 ENCOUNTER — Other Ambulatory Visit: Payer: Self-pay

## 2022-01-05 ENCOUNTER — Ambulatory Visit (HOSPITAL_COMMUNITY): Payer: 59 | Attending: Cardiovascular Disease

## 2022-01-05 DIAGNOSIS — R002 Palpitations: Secondary | ICD-10-CM | POA: Diagnosis present

## 2022-01-05 DIAGNOSIS — R0683 Snoring: Secondary | ICD-10-CM | POA: Diagnosis present

## 2022-01-05 LAB — ECHOCARDIOGRAM COMPLETE
Area-P 1/2: 3.27 cm2
S' Lateral: 2.6 cm

## 2022-01-05 NOTE — Progress Notes (Signed)
Let patient know echo shows normal heart function without significant valvular issues. °

## 2022-02-23 ENCOUNTER — Telehealth: Payer: Self-pay | Admitting: Internal Medicine

## 2022-02-23 NOTE — Telephone Encounter (Signed)
Patient called to give insurance information.  ?  ?[employer] ([employer]) ?Group # [id number] ?ID # [id number] ?Provider # [phone]. ? ?

## 2022-03-06 ENCOUNTER — Telehealth: Payer: Self-pay | Admitting: *Deleted

## 2022-03-06 NOTE — Telephone Encounter (Signed)
Patient is scheduled for lab study on 04/02/22. ?Patient understands her sleep study will be done at [facility] sleep lab. ?Patient understands she will receive a sleep packet in a week or so. ?Patient understands to call if she does not receive the sleep packet in a timely manner. ?Patient agrees with treatment and thanked me for call. ?Left detailed message on voicemail with date and time of study and informed patient to call back to confirm or reschedule.  ?

## 2022-03-14 NOTE — Telephone Encounter (Signed)
Received

## 2022-03-14 NOTE — Telephone Encounter (Signed)
Patient called to give insurance information.  ?  ?[employer]) ?Group # [id number] ?ID # [id number] ?Provider # [phone]. ? ?

## 2022-04-02 ENCOUNTER — Encounter (HOSPITAL_BASED_OUTPATIENT_CLINIC_OR_DEPARTMENT_OTHER): Payer: Self-pay | Admitting: Cardiology

## 2022-04-10 ENCOUNTER — Ambulatory Visit (HOSPITAL_BASED_OUTPATIENT_CLINIC_OR_DEPARTMENT_OTHER): Payer: BC Managed Care – PPO | Attending: Internal Medicine | Admitting: Cardiology

## 2022-04-10 DIAGNOSIS — G4733 Obstructive sleep apnea (adult) (pediatric): Secondary | ICD-10-CM | POA: Diagnosis not present

## 2022-04-10 DIAGNOSIS — R002 Palpitations: Secondary | ICD-10-CM | POA: Insufficient documentation

## 2022-04-10 DIAGNOSIS — R0683 Snoring: Secondary | ICD-10-CM | POA: Diagnosis not present

## 2022-04-11 NOTE — Procedures (Signed)
? ?  Patient Name: Tracey Nichols, Tracey Nichols ?Study Date:04/10/2022 ?Gender: Female ?D.O.B: 1971-08-22 ?Age (years): 67 ?Referring Provider: Alverda Skeans ?Height (inches): 62 ?Interpreting Physician: Armanda Magic MD, ABSM ?Weight (lbs): 165 ?RPSGT: Shelah Lewandowsky ?BMI: 30 ?MRN: [id number] ?Neck Size: 13.00 ? ?CLINICAL INFORMATION ?Sleep Study Type: NPSG ? ?Indication for sleep study: Fatigue, Hypertension, Obesity, Snoring ? ?Epworth Sleepiness Score: 5 ? ?SLEEP STUDY TECHNIQUE ?As per the AASM Manual for the Scoring of Sleep and Associated Events v2.3 (April 2016) with a hypopnea requiring 4% desaturations. ? ?The channels recorded and monitored were frontal, central and occipital EEG, electrooculogram (EOG), submentalis EMG (chin), nasal and oral airflow, thoracic and abdominal wall motion, anterior tibialis EMG, snore microphone, electrocardiogram, and pulse oximetry. ? ?MEDICATIONS ?Medications self-administered by patient taken the night of the study : CLONAZEPAM ? ?SLEEP ARCHITECTURE ?The study was initiated at 10:47:56 PM and ended at 4:52:44 AM. ? ?Sleep onset time was 65.8 minutes and the sleep efficiency was 75.2%. The total sleep time was 274.5 minutes. ? ?Stage REM latency was 191.0 minutes. ? ?The patient spent 14.4% of the night in stage N1 sleep, 76.7% in stage N2 sleep, 0.0% in stage N3 and 8.9% in REM. ? ?Alpha intrusion was absent. ? ?Supine sleep was 43.73%. ? ?RESPIRATORY PARAMETERS ?The overall apnea/hypopnea index (AHI) was 32.6 per hour. There were 1 total apneas, including 0 obstructive, 1 central and 0 mixed apneas. There were 148 hypopneas and 58 RERAs. ? ?The AHI during Stage REM sleep was 83.3 per hour. ? ?AHI while supine was 37.5 per hour. ? ?The mean oxygen saturation was 91.4%. The minimum SpO2 during sleep was 81.0%. ? ?moderate snoring was noted during this study. ? ?CARDIAC DATA ?The 2 lead EKG demonstrated sinus rhythm. The mean heart rate was 72.4 beats per minute. Other EKG findings  include: PVCs. ? ?LEG MOVEMENT DATA ?The total PLMS were 0 with a resulting PLMS index of 0.0. Associated arousal with leg movement index was 0.2 . ? ?IMPRESSIONS ?- Moderate obstructive sleep apnea occurred during this study (AHI = 32.6/h). ?- No significant central sleep apnea occurred during this study (CAI = 0.2/h). ?- Mild oxygen desaturation was noted during this study (Min O2 = 81.0%). ?- The patient snored with moderate snoring volume. ?- EKG findings include PVCs. ?- Clinically significant periodic limb movements did not occur during sleep. No significant associated arousals. ? ?DIAGNOSIS ?- Obstructive Sleep Apnea (G47.33) ?- Nocturnal Hypoxemia (G47.36) ? ?RECOMMENDATIONS ?- Therapeutic CPAP titration to determine optimal pressure required to alleviate sleep disordered breathing. ?- Avoid alcohol, sedatives and other CNS depressants that may worsen sleep apnea and disrupt normal sleep architecture. ?- Sleep hygiene should be reviewed to assess factors that may improve sleep quality. ?- Weight management and regular exercise should be initiated or continued if appropriate. ? ?[Electronically signed] 04/11/2022 02:13 PM ? ?Armanda Magic MD, ABSM ?Diplomate, Biomedical engineer of Sleep Medicine ?

## 2022-04-24 ENCOUNTER — Telehealth: Payer: Self-pay | Admitting: *Deleted

## 2022-04-24 DIAGNOSIS — G4733 Obstructive sleep apnea (adult) (pediatric): Secondary | ICD-10-CM

## 2022-04-24 NOTE — Telephone Encounter (Signed)
The patient has been notified of the result. Left detailed message on voicemail and informed patient to call back to review her result. Latrelle Dodrill, CMA 04/24/2022 11:40 AM   ?

## 2022-04-24 NOTE — Telephone Encounter (Signed)
-----   Message from Lauralee Evener, [place] sent at 04/12/2022  8:20 AM EDT ----- ? ?----- Message ----- ?From: Sueanne Margarita, MD ?Sent: 04/11/2022   2:14 PM EDT ?To: [facility] Div Sleep Studies ? ?Please let patient know that they have sleep apnea.  Recommend therapeutic CPAP titration for treatment of patient's sleep disordered breathing.  If unable to perform an in lab titration then initiate ResMed auto CPAP from 4 to 15cm H2O with heated humidity and mask of choice and overnight pulse ox on CPAP.    ? ?

## 2022-04-25 ENCOUNTER — Encounter: Payer: Self-pay | Admitting: Internal Medicine

## 2022-04-25 ENCOUNTER — Telehealth: Payer: Self-pay | Admitting: Internal Medicine

## 2022-04-25 NOTE — Addendum Note (Signed)
Addended by: Reesa ChewJONES, Lindsea Olivar G on: 04/25/2022 08:47 AM ? ? Modules accepted: Orders ? ?

## 2022-04-25 NOTE — Telephone Encounter (Signed)
Pt requested appt in regards to results of a sleep study done with Dr Lynnette Caffeyhukkani. States that she was advised to, "Circle back with [facility] because Dr Mayford Knifeurner is booked".  ?Scheduled appt for 5/4 per patient request and advised I would send a telephone note in regards to it.  ?

## 2022-04-25 NOTE — Telephone Encounter (Signed)
Prior Authorization for TITRATION sent to [facility] via Phone. -APPROVED- NO PA REQUIRED ?REF# [id number] 04/25/22 ?

## 2022-04-25 NOTE — Telephone Encounter (Signed)
Return call: ?Gave patient recommendations made by Dr. Radford Pax and  understanding was verbalized.  ?

## 2022-04-25 NOTE — Progress Notes (Signed)
?Cardiology Office Note:   ? ?Date:  04/26/2022  ? ?ID:  Tracey Nichols, DOB 12-08-1971, MRN [id number] ? ?PCP:  Vernie Shanks, MD  ? ?[facility] Providers ?Cardiologist:  Lenna Sciara, MD ?Referring MD: Vernie Shanks, MD  ? ?Chief Complaint/Reason for Referral: Follow-up for sleep apnea ? ?ASSESSMENT:   ? ?1. Palpitations   ?2. OSA (obstructive sleep apnea)   ?3. Hypertension, unspecified type   ? ? ?PLAN:   ? ?In order of problems listed above: ?1.  Palpitations: Monitor and echocardiogram with reassuring. ?2.  Obstructive sleep apnea: Patient will follow-up with the sleep medicine team here in our division regarding CPAP titration and management.  We will keep follow-up with me open-ended.  The patient agrees with this ?3. Hypertension: Blood pressure is mildly above goal.  If her blood pressure remains elevated whenever she sees another healthcare provider her losartan could be uptitrated. ? ? ?     ? ?  ? ?Dispo:  Return if symptoms worsen or fail to improve.  ? ?  ? ?Medication Adjustments/Labs and Tests Ordered: ?Current medicines are reviewed at length with the patient today.  Concerns regarding medicines are outlined above. ? ?The following changes have been made:  no change  ? ?Labs/tests ordered: ?No orders of the defined types were placed in this encounter. ? ? ?Medication Changes: ?No orders of the defined types were placed in this encounter. ? ? ? ?Current medicines are reviewed at length with the patient today.  The patient does not have concerns regarding medicines. ? ? ?History of Present Illness:   ? ?FOCUSED PROBLEM LIST:   ?1.  Hypertension ?2.  Hypothyroidism ?3.  Obstructive sleep apnea ? ?The patient is a 51 y.o. female with the indicated medical history here for follow-up to discuss sleep apnea evaluation results. ? ?December 2022: Patient was seen for initial consultation regarding palpitations.  She was referred for echocardiogram and Holter monitor which demonstrated benign  findings. ? ?Today: The patient is about the same.  She occasionally gets palpitations at night.  She does not sleep well.  She denies any other cardiovascular symptoms however.  She was rather surprised to hear the news that she has rather severe obstructive sleep apnea.  She is looking forward to neck steps and hopefully treating this problem.  She has required no emergency room visits or hospitalizations recently.  She does complain of daytime somnolence. ?  ? ?Current Medications: ?Current Meds  ?Medication Sig  ? acetaminophen (TYLENOL) 500 MG tablet Take 500 mg by mouth every 6 (six) hours as needed. Pt take 2 tablet 1000 mg PRN  ? aspirin-acetaminophen-caffeine (EXCEDRIN MIGRAINE) 250-250-65 MG tablet Take by mouth every 6 (six) hours as needed for headache.  ? busPIRone (BUSPAR) 30 MG tablet   ? clonazePAM (KLONOPIN) 1 MG tablet Take 0.5 mg by mouth as needed for anxiety.   ? escitalopram (LEXAPRO) 10 MG tablet daily.  ? Etanercept (ENBREL SURECLICK [place]) Inject 50 mg into the skin.  ? fluticasone (FLONASE) 50 MCG/ACT nasal spray Place into both nostrils daily.  ? ibuprofen (ADVIL,MOTRIN) 200 MG tablet Take 200 mg by mouth every 6 (six) hours as needed.  ? levothyroxine (SYNTHROID, LEVOTHROID) 100 MCG tablet Take 100 mcg by mouth daily.  ? loratadine (CLARITIN) 10 MG tablet Take 10 mg by mouth daily.  ? losartan (COZAAR) 50 MG tablet Take 50 mg by mouth daily.  ? [DISCONTINUED] Abatacept (ORENCIA CLICKJECT) 0000000 MG/ML SOAJ Inject 125 mg into the  skin once a week.  ? [DISCONTINUED] methotrexate (RHEUMATREX) 2.5 MG tablet TAKE 7 TABLETS BY MOUTH ONCE A WEEK  ? [DISCONTINUED] pantoprazole (PROTONIX) 40 MG tablet   ? [DISCONTINUED] Polyethyl Glycol-Propyl Glycol (SYSTANE OP) Apply to eye.  ? [DISCONTINUED] predniSONE (DELTASONE) 5 MG tablet Take 4 tablets by mouth daily for 4 days, 3 tablets po x4 days, 2 tablets po x4 days, 1 tablets po x4 days.  ?  ? ?Allergies:    ?Propylthiouracil, Wellbutrin [bupropion hcl],  and Sulfa antibiotics  ? ?Social History:   ?Social History  ? ?Tobacco Use  ? Smoking status: Former  ?  Packs/day: 0.25  ?  Years: 12.00  ?  Pack years: 3.00  ?  Types: Cigarettes  ?  Quit date: 12/25/2007  ?  Years since quitting: 14.3  ? Smokeless tobacco: Never  ?Vaping Use  ? Vaping Use: Never used  ?Substance Use Topics  ? Alcohol use: Yes  ?  Alcohol/week: 3.0 standard drinks  ?  Types: 3 Glasses of wine per week  ? Drug use: No  ?  ? ?Family Hx: ?Family History  ?Problem Relation Age of Onset  ? Goiter Mother   ? Migraines Mother   ? Hyperlipidemia Mother   ? Hypertension Father   ?     paternal grandmother  ? Asthma Brother   ? Heart disease Unknown   ?     both sets of grandparents  ? Rheum arthritis Sister   ? Lung cancer Paternal Grandfather   ? Diabetes Paternal Grandmother   ? Arthritis Maternal Grandmother   ? Healthy Son   ? Healthy Daughter   ? Healthy Daughter   ?  ? ?Review of Systems:   ?Please see the history of present illness.    ?All other systems reviewed and are negative. ?  ? ? ?EKGs/Labs/Other Test Reviewed:   ? ?EKG:  EKG performed 2022 that I personally reviewed demonstrates sinus rhythm ? ?Prior CV studies: ? ?Sleep study 2023: ?- Moderate obstructive sleep apnea occurred during this study (AHI = 32.6/h). ?- No significant central sleep apnea occurred during this study (CAI = 0.2/h). ?- Mild oxygen desaturation was noted during this study (Min O2 = 81.0%). ?- The patient snored with moderate snoring volume. ?- EKG findings include PVCs. ?- Clinically significant periodic limb movements did not occur during sleep. No significant associated arousals. ? ?TTE 2023 ejection fraction 55 to 60% with no significant valvular abnormalities ? ?Monitor 2023: Sinus rhythm with isolated PACs. ? ? ?Other studies Reviewed: ?Review of the additional studies/records demonstrates: Abdominal ultrasound without aortic atherosclerosis ? ?Recent Labs: ?No results found for requested labs within last 8760  hours.  ? ?Recent Lipid Panel ?No results found for: CHOL, TRIG, HDL, LDLCALC, LDLDIRECT ? ?Risk Assessment/Calculations:   ? ? ?    ? ?Physical Exam:   ? ?VS:  BP 138/90   Pulse 86   Ht 5\' 2"  (1.575 m)   Wt 175 lb (79.4 kg)   LMP 08/17/2014   SpO2 98%   BMI 32.01 kg/m?    ?Wt Readings from Last 3 Encounters:  ?04/26/22 175 lb (79.4 kg)  ?04/10/22 165 lb (74.8 kg)  ?12/07/21 175 lb 3.2 oz (79.5 kg)  ?  ?GENERAL:  No apparent distress, AOx3 ?HEENT:  No carotid bruits, +2 carotid impulses, no scleral icterus ?CAR: RRR no murmurs, gallops, rubs, or thrills ?RES:  Clear to auscultation bilaterally ?ABD:  Soft, nontender, nondistended, positive bowel sounds x 4 ?VASC:  +  2 radial pulses, +2 carotid pulses, palpable pedal pulses ?NEURO:  CN 2-12 grossly intact; motor and sensory grossly intact ?PSYCH:  No active depression or anxiety ?EXT:  No edema, ecchymosis, or cyanosis ? ?Signed, ?Early Osmond, MD  ?04/26/2022 2:56 PM    ?[facility] ?[address], [place], [place]  [address] ?Phone: [phone]; Fax: [phone]  ? ?Note:  This document was prepared using Dragon voice recognition software and may include unintentional dictation errors. ?

## 2022-04-26 ENCOUNTER — Encounter: Payer: Self-pay | Admitting: Internal Medicine

## 2022-04-26 ENCOUNTER — Ambulatory Visit: Payer: 59 | Admitting: Internal Medicine

## 2022-04-26 VITALS — BP 138/90 | HR 86 | Ht 62.0 in | Wt 175.0 lb

## 2022-04-26 DIAGNOSIS — G4733 Obstructive sleep apnea (adult) (pediatric): Secondary | ICD-10-CM

## 2022-04-26 DIAGNOSIS — I10 Essential (primary) hypertension: Secondary | ICD-10-CM | POA: Diagnosis not present

## 2022-04-26 DIAGNOSIS — R002 Palpitations: Secondary | ICD-10-CM | POA: Diagnosis not present

## 2022-04-26 NOTE — Patient Instructions (Addendum)
Medication Instructions:  ?No changes ?*If you need a refill on your cardiac medications before your next appointment, please call your pharmacy* ? ? ?Lab Work: ?none ?If you have labs (blood work) drawn today and your tests are completely normal, you will receive your results only by: ?MyChart Message (if you have MyChart) OR ?A paper copy in the mail ?If you have any lab test that is abnormal or we need to change your treatment, we will call you to review the results. ? ? ?Testing/Procedures: ?none ? ? ?Follow-Up: ?With cardiology as needed. ? ?With Dr. Radford Pax for sleep--approval for titration was received yesterday 04/25/22.  The sleep lab will call you to schedule the titration and then you will hear about your results and plan for follow up once Dr. Radford Pax reviews.  ? ?Important Information About Sugar ? ? ? ? ?  ?

## 2022-06-03 ENCOUNTER — Ambulatory Visit (HOSPITAL_BASED_OUTPATIENT_CLINIC_OR_DEPARTMENT_OTHER): Payer: 59 | Admitting: Cardiology

## 2022-06-04 ENCOUNTER — Ambulatory Visit (HOSPITAL_BASED_OUTPATIENT_CLINIC_OR_DEPARTMENT_OTHER): Payer: 59 | Attending: Cardiology | Admitting: Cardiology

## 2022-06-04 VITALS — Ht 62.0 in | Wt 168.0 lb

## 2022-06-04 DIAGNOSIS — Z79899 Other long term (current) drug therapy: Secondary | ICD-10-CM | POA: Insufficient documentation

## 2022-06-04 DIAGNOSIS — G4733 Obstructive sleep apnea (adult) (pediatric): Secondary | ICD-10-CM | POA: Diagnosis present

## 2022-06-07 NOTE — Procedures (Signed)
   Patient Name: Tracey Nichols, Tracey Nichols Date: 06/04/2022 Gender: Female D.O.B: 1971/05/01 Age (years): 65 Referring Provider: Alverda Skeans, MD Height (inches): 62 Interpreting Physician: Armanda Magic MD, ABSM Weight (lbs): 168 RPSGT: Shelah Lewandowsky BMI: 31 MRN: [id number] Neck Size: 13.00  CLINICAL INFORMATION The patient is referred for a CPAP titration to treat sleep apnea.  SLEEP STUDY TECHNIQUE As per the AASM Manual for the Scoring of Sleep and Associated Events v2.3 (April 2016) with a hypopnea requiring 4% desaturations.  The channels recorded and monitored were frontal, central and occipital EEG, electrooculogram (EOG), submentalis EMG (chin), nasal and oral airflow, thoracic and abdominal wall motion, anterior tibialis EMG, snore microphone, electrocardiogram, and pulse oximetry. Continuous positive airway pressure (CPAP) was initiated at the beginning of the study and titrated to treat sleep-disordered breathing.  MEDICATIONS Medications self-administered by patient taken the night of the study : CLONAZEPAM  TECHNICIAN COMMENTS Comments added by technician: None Comments added by scorer: N/A  RESPIRATORY PARAMETERS Optimal PAP Pressure (cm): 9  AHI at Optimal Pressure (/hr):0.9 Overall Minimal O2 (%):89.0  Supine % at Optimal Pressure (%):0 Minimal O2 at Optimal Pressure (%): 92.0   SLEEP ARCHITECTURE The study was initiated at 10:37:57 PM and ended at 4:58:34 AM.  Sleep onset time was 34.4 minutes and the sleep efficiency was 72.9%. The total sleep time was 277.5 minutes.  The patient spent 8.6% of the night in stage N1 sleep, 82.3% in stage N2 sleep, 0.2% in stage N3 and 8.8% in REM.Stage REM latency was 165.5 minutes  Wake after sleep onset was 68.7. Alpha intrusion was absent. Supine sleep was 56.58%.  CARDIAC DATA The 2 lead EKG demonstrated sinus rhythm. The mean heart rate was 68.2 beats per minute. Other EKG findings include: None.  LEG MOVEMENT  DATA The total Periodic Limb Movements of Sleep (PLMS) were 0. The PLMS index was 0.0. A PLMS index of <15 is considered normal in adults.  IMPRESSIONS - The optimal PAP pressure was 9 cm of water. - Mild oxygen desaturations were observed during this titration (min O2 = 89.0%). - The patient snored with moderate snoring volume during this titration study. - No cardiac abnormalities were observed during this study. - Clinically significant periodic limb movements were not noted during this study. Arousals associated with PLMs were rare.  DIAGNOSIS - Obstructive Sleep Apnea (G47.33)  RECOMMENDATIONS - Trial of CPAP therapy on 9 cm H2O with a Standard size Resmed Nasal AirFit P30I mask and heated humidification. - Avoid alcohol, sedatives and other CNS depressants that may worsen sleep apnea and disrupt normal sleep architecture. - Sleep hygiene should be reviewed to assess factors that may improve sleep quality. - Weight management and regular exercise should be initiated or continued. - Return to [facility] Center for re-evaluation after 6 weeks of therapy  [Electronically signed] 06/07/2022 01:00 PM  Armanda Magic MD, ABSM Diplomate, American Board of Sleep Medicine

## 2022-06-18 ENCOUNTER — Telehealth: Payer: Self-pay | Admitting: *Deleted

## 2022-10-01 ENCOUNTER — Telehealth: Payer: Self-pay | Admitting: Cardiology

## 2022-10-01 NOTE — Telephone Encounter (Signed)
Called to schedule sleep compliance appt, pt cut me off and asked if she could c/b.

## 2022-11-02 ENCOUNTER — Ambulatory Visit: Payer: 59 | Admitting: Cardiology

## 2022-11-21 ENCOUNTER — Ambulatory Visit: Payer: 59 | Attending: Cardiology | Admitting: Cardiology

## 2022-11-21 ENCOUNTER — Ambulatory Visit: Payer: 59 | Admitting: Cardiology

## 2022-11-21 VITALS — Ht 62.5 in | Wt 165.0 lb

## 2022-11-21 DIAGNOSIS — G4733 Obstructive sleep apnea (adult) (pediatric): Secondary | ICD-10-CM | POA: Diagnosis not present

## 2022-11-21 NOTE — Progress Notes (Addendum)
SLEEP MEDICINE VIRTUAL CONSULT NOTE via Video Note   Because of Tracey Nichols co-morbid illnesses, she is at least at moderate risk for complications without adequate follow up.  This format is felt to be most appropriate for this patient at this time.  All issues noted in this document were discussed and addressed.  A limited physical exam was performed with this format.  Please refer to the patient's chart for her consent to telehealth for [facility].      Date:  11/21/2022   ID:  Tracey Nichols, DOB [birth year], MRN [id number] The patient was identified using 2 identifiers.  Patient Location: Home Provider Location: Office/Clinic   PCP:  Tracey Shanks, Nichols (Inactive)   [place] Providers Cardiologist:  None Sleep Medicine:  Tracey Nichols     Evaluation Performed:  Consultation - Tracey Nichols was referred by Tracey Sciara, Nichols for the evaluation of OSA.  Chief Complaint:  OSA  History of Present Illness:    Tracey Nichols is a 51 y.o. female who is being seen today for the evaluation of OSA at the request of Tracey Sciara, Nichols.  Tracey Nichols is a 51 y.o. female with a hx of anxiety, fatigue, GERD, depression, hypothyroidism, RA and seizure disorder.  She was seen by Dr. Ali Lowe for palpitations and complained of snoring and he ordered a HST.  HST showed severe OSA with an AHI of 32/hr and nocturnal Hypoxemia.  She underwent CPAP titration to 9cm H2O.  She is now referred by Dr. Ali Lowe to establish sleep care.  She tells me that she has had problems with snoring in the past and would wake up some gasping for breath.  She also would wake up feeling tired in the am.  She is doing well with her PAP device and thinks that she has gotten used to it.  She tolerates the nasal mask and feels the pressure is adequate.  Since going on PAP she feels rested in the am and has no significant daytime sleepiness.  She denies any nasal dryness or nasal  congestion. She has some mild mouth dryness at times.  She does not  know if she snores but is no longer waking up gasping for breath.    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   [name]   Fatigue    Fever    GAD (generalized anxiety disorder)    GERD (gastroesophageal reflux disease) 2003   years ago - no meds now   Hypertension    since age 14- usually ok when pregnant   Hypertension    Hyperthyroidism    Had hyperthyroidism in first pregnancy, subsequently treated with radioiodine   Hypothyroidism    s/p radioiodine treatment for hyperthyroidism   Hypothyroidism complicating pregnancy 99991111   Influenza A    Marital stress    Migraines    Nasal abscess    Neuritis    Obesity    Palpitations    Pleural effusion    PP care - s/p C/S 5/7 - twins 04/29/2012   Rheumatoid arthritis(714.0)    Rhinitis    RUQ abdominal pain    Seizure (HCC)    Throat pain  Vitamin D deficiency    Past Surgical History:  Procedure Laterality Date   CESAREAN SECTION     CESAREAN SECTION  04/29/2012   Procedure: CESAREAN SECTION;  Surgeon: Lenoard Aden, Nichols;  Location: [facility];  Service: Gynecology;  Laterality: N/A;  Repeat Cesarean Section; Twins     Current Meds  Medication Sig   acetaminophen (TYLENOL) 500 MG tablet Take 500 mg by mouth every 6 (six) hours as needed. Pt take 2 tablet 1000 mg PRN   aspirin-acetaminophen-caffeine (EXCEDRIN MIGRAINE) 250-250-65 MG tablet Take by mouth every 6 (six) hours as needed for headache.   clonazePAM (KLONOPIN) 1 MG tablet Take 0.5 mg by mouth as needed for anxiety.    escitalopram (LEXAPRO) 10 MG tablet Take 10 mg by mouth daily.   Etanercept (ENBREL SURECLICK [place]) Inject 50 mg into the skin once a week.   fluticasone (FLONASE) 50 MCG/ACT nasal spray  Place into both nostrils daily.   ibuprofen (ADVIL,MOTRIN) 200 MG tablet Take 200 mg by mouth every 6 (six) hours as needed.   levothyroxine (SYNTHROID, LEVOTHROID) 100 MCG tablet Take 100 mcg by mouth daily.   loratadine (CLARITIN) 5 MG chewable tablet Chew 5 mg by mouth daily as needed for allergies (allergies).   losartan (COZAAR) 100 MG tablet Take 100 mg by mouth daily.   Vitamin D, Ergocalciferol, (DRISDOL) 1.25 MG (50000 UNIT) CAPS capsule Take 50,000 Units by mouth every 7 (seven) days.   [DISCONTINUED] loratadine (CLARITIN) 10 MG tablet Take 10 mg by mouth daily.   [DISCONTINUED] losartan (COZAAR) 50 MG tablet Take 50 mg by mouth daily.     Allergies:   Propylthiouracil, Wellbutrin [bupropion hcl], and Sulfa antibiotics   Social History   Tobacco Use   Smoking status: Former    Packs/day: 0.25    Years: 12.00    Total pack years: 3.00    Types: Cigarettes    Quit date: 12/25/2007    Years since quitting: 14.9   Smokeless tobacco: Never  Vaping Use   Vaping Use: Never used  Substance Use Topics   Alcohol use: Yes    Alcohol/week: 3.0 standard drinks of alcohol    Types: 3 Glasses of wine per week   Drug use: No     Family Hx: The patient's family history includes Arthritis in her maternal grandmother; Asthma in her brother; Diabetes in her paternal grandmother; Goiter in her mother; Healthy in her daughter, daughter, and son; Heart disease in her unknown relative; Hyperlipidemia in her mother; Hypertension in her father; Lung cancer in her paternal grandfather; Migraines in her mother; Rheum arthritis in her sister.  ROS:   Please see the history of present illness.     All other systems reviewed and are negative.   Prior Sleep studies:   The following studies were reviewed today:  PSG, PAP compliance download and CPAP titration  Labs/Other Tests and Data Reviewed:     Recent Labs: No results found for requested labs within last 365 days.    Wt Readings from  Last 3 Encounters:  11/21/22 165 lb (74.8 kg)  06/04/22 168 lb (76.2 kg)  04/26/22 175 lb (79.4 kg)     Risk Assessment/Calculations:          Objective:    Vital Signs:  Ht 5' 2.5" (1.588 m)   Wt 165 lb (74.8 kg)   LMP 08/17/2014   BMI 29.70 kg/m    VITAL SIGNS:  reviewed GEN:  no acute distress EYES:  sclerae anicteric, EOMI - Extraocular Movements Intact RESPIRATORY:  normal respiratory effort, symmetric expansion CARDIOVASCULAR:  no peripheral edema SKIN:  no rash, lesions or ulcers. MUSCULOSKELETAL:  no obvious deformities. NEURO:  alert and oriented x 3, no obvious focal deficit PSYCH:  normal affect  ASSESSMENT & PLAN:    OSA - The patient is tolerating PAP therapy well without any problems. The PAP download performed by his DME was personally reviewed and interpreted by me today and showed an AHI of 2.3/hr on 9 cm H2O with 57% compliance in using more than 4 hours nightly.  The patient has been using and benefiting from PAP use and will continue to benefit from therapy.  -I encouraged her to be more compliant with her device -encouraged her to try to use saline spray daily   Time:   Today, I have spent 20 minutes with the patient with telehealth technology discussing the above problems.     Medication Adjustments/Labs and Tests Ordered: Current medicines are reviewed at length with the patient today.  Concerns regarding medicines are outlined above.   Tests Ordered: No orders of the defined types were placed in this encounter.   Medication Changes: No orders of the defined types were placed in this encounter.   Follow Up: 3 month virtual  Signed, Tracey Nichols  11/21/2022 4:12 PM    [place]

## 2022-11-21 NOTE — Patient Instructions (Addendum)
Medication Instructions:  Your physician recommends that you continue on your current medications as directed. Please refer to the Current Medication list given to you today.  *If you need a refill on your cardiac medications before your next appointment, please call your pharmacy*   Lab Work: None. If you have labs (blood work) drawn today and your tests are completely normal, you will receive your results only by: MyChart Message (if you have MyChart) OR A paper copy in the mail If you have any lab test that is abnormal or we need to change your treatment, we will call you to review the results.   Testing/Procedures: None.   Follow-Up: At [facility], you and your health needs are our priority.  As part of our continuing mission to provide you with exceptional heart care, we have created designated Provider Care Teams.  These Care Teams include your primary Cardiologist (physician) and Advanced Practice Providers (APPs -  Physician Assistants and Nurse Practitioners) who all work together to provide you with the care you need, when you need it.  We recommend signing up for the patient portal called "MyChart".  Sign up information is provided on this After Visit Summary.  MyChart is used to connect with patients for Virtual Visits (Telemedicine).  Patients are able to view lab/test results, encounter notes, upcoming appointments, etc.  Non-urgent messages can be sent to your provider as well.   To learn more about what you can do with MyChart, go to [email or web address].    Your next appointment:   3 month(s)  The format for your next appointment:   Virtual Visit   Provider:   Dr. Armanda Magic, MD    Important Information About Sugar
